# Patient Record
Sex: Male | Born: 1945
Health system: Southern US, Community
[De-identification: ages and names within clinical notes are randomized; demographics above are authoritative.]

## PROBLEM LIST (undated history)

## (undated) DIAGNOSIS — N189 Chronic kidney disease, unspecified: Secondary | ICD-10-CM

## (undated) DIAGNOSIS — M1990 Unspecified osteoarthritis, unspecified site: Secondary | ICD-10-CM

## (undated) DIAGNOSIS — H269 Unspecified cataract: Secondary | ICD-10-CM

## (undated) DIAGNOSIS — I10 Essential (primary) hypertension: Secondary | ICD-10-CM

## (undated) DIAGNOSIS — K219 Gastro-esophageal reflux disease without esophagitis: Secondary | ICD-10-CM

## (undated) DIAGNOSIS — E119 Type 2 diabetes mellitus without complications: Secondary | ICD-10-CM

## (undated) DIAGNOSIS — E785 Hyperlipidemia, unspecified: Secondary | ICD-10-CM

## (undated) DIAGNOSIS — M109 Gout, unspecified: Secondary | ICD-10-CM

## (undated) HISTORY — PX: EYE SURGERY: SHX253

## (undated) HISTORY — DX: Hyperlipidemia, unspecified: E78.5

## (undated) HISTORY — DX: Gastro-esophageal reflux disease without esophagitis: K21.9

## (undated) HISTORY — DX: Unspecified cataract: H26.9

## (undated) HISTORY — DX: Chronic kidney disease, unspecified: N18.9

## (undated) HISTORY — DX: Type 2 diabetes mellitus without complications: E11.9

## (undated) HISTORY — DX: Gout, unspecified: M10.9

## (undated) HISTORY — PX: CHOLECYSTECTOMY: SHX55

## (undated) HISTORY — DX: Unspecified osteoarthritis, unspecified site: M19.90

## (undated) HISTORY — DX: Essential (primary) hypertension: I10

---

## 2021-03-13 LAB — COLOGUARD: Cologuard: NEGATIVE

## 2021-06-20 ENCOUNTER — Encounter: Payer: Self-pay | Admitting: Sports Medicine

## 2021-06-20 ENCOUNTER — Other Ambulatory Visit: Payer: Self-pay

## 2021-06-20 ENCOUNTER — Ambulatory Visit (INDEPENDENT_AMBULATORY_CARE_PROVIDER_SITE_OTHER): Payer: Medicare HMO | Admitting: Sports Medicine

## 2021-06-20 DIAGNOSIS — E6609 Other obesity due to excess calories: Secondary | ICD-10-CM

## 2021-06-20 DIAGNOSIS — E782 Mixed hyperlipidemia: Secondary | ICD-10-CM | POA: Insufficient documentation

## 2021-06-20 DIAGNOSIS — K219 Gastro-esophageal reflux disease without esophagitis: Secondary | ICD-10-CM | POA: Diagnosis not present

## 2021-06-20 DIAGNOSIS — Z0000 Encounter for general adult medical examination without abnormal findings: Secondary | ICD-10-CM | POA: Diagnosis not present

## 2021-06-20 DIAGNOSIS — I10 Essential (primary) hypertension: Secondary | ICD-10-CM | POA: Diagnosis not present

## 2021-06-20 DIAGNOSIS — E1122 Type 2 diabetes mellitus with diabetic chronic kidney disease: Secondary | ICD-10-CM

## 2021-06-20 DIAGNOSIS — M10041 Idiopathic gout, right hand: Secondary | ICD-10-CM

## 2021-06-20 DIAGNOSIS — N182 Chronic kidney disease, stage 2 (mild): Secondary | ICD-10-CM | POA: Diagnosis not present

## 2021-06-20 DIAGNOSIS — E669 Obesity, unspecified: Secondary | ICD-10-CM | POA: Insufficient documentation

## 2021-06-20 DIAGNOSIS — M109 Gout, unspecified: Secondary | ICD-10-CM | POA: Insufficient documentation

## 2021-06-20 NOTE — Assessment & Plan Note (Signed)
Checking labs including liver function, ferritin levels, hepatic ultrasound. I would like him to establish with hematology/oncology here in [place] as he was getting serial phlebotomies in [place].

## 2021-06-20 NOTE — Assessment & Plan Note (Signed)
Controlled, no changes. 

## 2021-06-20 NOTE — Assessment & Plan Note (Signed)
Diabetes mellitus type 2, tells me his last A1c was upper sixes. Rechecking labs, foot exam today, referral for diabetic eye exam. We will work on aggressive weight loss as well.

## 2021-06-20 NOTE — Assessment & Plan Note (Signed)
Currently having a flare and currently on prednisone, we will recheck his uric acid levels and adjust allopurinol for a target uric acid less than 5. I did advise him for future flares we will do intra-articular injections rather than systemic prednisone.

## 2021-06-20 NOTE — Assessment & Plan Note (Signed)
Stable and well controlled. 

## 2021-06-20 NOTE — Progress Notes (Signed)
    Procedures performed today:    None.  Independent interpretation of notes and tests performed by another provider:   None.  Brief History, Exam, Impression, and Recommendations:    Obesity Thomas Leonard has diabetes, hypertension, hyperlipidemia, stage II CKD, gout, he would certainly benefit from aggressive weight loss, I would like the [facility] to weigh in.  Annual physical exam Pleasant 75 year old male, recently moved from [place]. Up-to-date on screening measures.  Benign essential hypertension Controlled, no changes.  GERD (gastroesophageal reflux disease) Stable and controlled, no changes.  Gout Currently having a flare and currently on prednisone, we will recheck his uric acid levels and adjust allopurinol for a target uric acid less than 5. I did advise him for future flares we will do intra-articular injections rather than systemic prednisone.  Hemochromatosis Checking labs including liver function, ferritin levels, hepatic ultrasound. I would like him to establish with hematology/oncology here in [place] as he was getting serial phlebotomies in [place].  Mixed hyperlipidemia Stable and well-controlled.  Type 2 diabetes mellitus with diabetic chronic kidney disease (HCC) Diabetes mellitus type 2, tells me his last A1c was upper sixes. Rechecking labs, foot exam today, referral for diabetic eye exam. We will work on aggressive weight loss as well.    ___________________________________________ Thomas Leonard. Benjamin Stain, M.D., ABFM., CAQSM. Primary Care and Sports Medicine [facility]  Adjunct Instructor of Family Medicine  [employer]

## 2021-06-20 NOTE — Assessment & Plan Note (Addendum)
Pleasant 75 year old male, recently moved from [place]. Up-to-date on screening measures.

## 2021-06-20 NOTE — Assessment & Plan Note (Signed)
Stable and controlled, no changes 

## 2021-06-20 NOTE — Assessment & Plan Note (Signed)
Thomas Leonard has diabetes, hypertension, hyperlipidemia, stage II CKD, gout, he would certainly benefit from aggressive weight loss, I would like the healthy weight and wellness center to weigh in.

## 2021-06-24 ENCOUNTER — Telehealth: Payer: Self-pay | Admitting: *Deleted

## 2021-06-24 NOTE — Telephone Encounter (Signed)
Per referral Dr. Thekkekandam - called and gave upcoming appointments - confirmed - mailed welcome packet with calendar 

## 2021-06-28 ENCOUNTER — Other Ambulatory Visit: Payer: Medicare HMO

## 2021-07-02 ENCOUNTER — Ambulatory Visit: Payer: Medicare HMO | Admitting: Hematology & Oncology

## 2021-07-02 ENCOUNTER — Other Ambulatory Visit: Payer: Medicare HMO

## 2021-07-10 DIAGNOSIS — M10041 Idiopathic gout, right hand: Secondary | ICD-10-CM | POA: Diagnosis not present

## 2021-07-10 DIAGNOSIS — E1122 Type 2 diabetes mellitus with diabetic chronic kidney disease: Secondary | ICD-10-CM | POA: Diagnosis not present

## 2021-07-10 DIAGNOSIS — N182 Chronic kidney disease, stage 2 (mild): Secondary | ICD-10-CM | POA: Diagnosis not present

## 2021-07-11 LAB — COMPREHENSIVE METABOLIC PANEL
AG Ratio: 1.3 (calc) (ref 1.0–2.5)
ALT: 26 U/L (ref 9–46)
AST: 17 U/L (ref 10–35)
Albumin: 3.9 g/dL (ref 3.6–5.1)
Alkaline phosphatase (APISO): 56 U/L (ref 35–144)
BUN/Creatinine Ratio: 28 (calc) — ABNORMAL HIGH (ref 6–22)
BUN: 36 mg/dL — ABNORMAL HIGH (ref 7–25)
CO2: 26 mmol/L (ref 20–32)
Calcium: 9.3 mg/dL (ref 8.6–10.3)
Chloride: 103 mmol/L (ref 98–110)
Creat: 1.29 mg/dL — ABNORMAL HIGH (ref 0.70–1.28)
Globulin: 2.9 g/dL (calc) (ref 1.9–3.7)
Glucose, Bld: 170 mg/dL — ABNORMAL HIGH (ref 65–99)
Potassium: 4.2 mmol/L (ref 3.5–5.3)
Sodium: 140 mmol/L (ref 135–146)
Total Bilirubin: 0.8 mg/dL (ref 0.2–1.2)
Total Protein: 6.8 g/dL (ref 6.1–8.1)

## 2021-07-11 LAB — TSH: TSH: 4.17 mIU/L (ref 0.40–4.50)

## 2021-07-11 LAB — URIC ACID: Uric Acid, Serum: 5.3 mg/dL (ref 4.0–8.0)

## 2021-07-11 LAB — LIPID PANEL
Cholesterol: 125 mg/dL (ref <200)
HDL: 31 mg/dL — ABNORMAL LOW (ref >=40)
LDL Cholesterol (Calc): 70 mg/dL (calc)
Non-HDL Cholesterol (Calc): 94 mg/dL (calc) (ref <130)
Total CHOL/HDL Ratio: 4 (calc) (ref <5.0)
Triglycerides: 166 mg/dL — ABNORMAL HIGH (ref <150)

## 2021-07-11 LAB — HEMOGLOBIN A1C
Hgb A1c MFr Bld: 7.3 % of total Hgb — ABNORMAL HIGH (ref <5.7)
Mean Plasma Glucose: 163 mg/dL
eAG (mmol/L): 9 mmol/L

## 2021-07-11 LAB — CBC
HCT: 39.4 % (ref 38.5–50.0)
Hemoglobin: 13.1 g/dL — ABNORMAL LOW (ref 13.2–17.1)
MCH: 32.6 pg (ref 27.0–33.0)
MCHC: 33.2 g/dL (ref 32.0–36.0)
MCV: 98 fL (ref 80.0–100.0)
MPV: 11.3 fL (ref 7.5–12.5)
Platelets: 173 10*3/uL (ref 140–400)
RBC: 4.02 10*6/uL — ABNORMAL LOW (ref 4.20–5.80)
RDW: 13.1 % (ref 11.0–15.0)
WBC: 7.7 10*3/uL (ref 3.8–10.8)

## 2021-07-11 LAB — FERRITIN: Ferritin: 367 ng/mL (ref 24–380)

## 2021-07-17 DIAGNOSIS — H524 Presbyopia: Secondary | ICD-10-CM | POA: Diagnosis not present

## 2021-07-17 DIAGNOSIS — E119 Type 2 diabetes mellitus without complications: Secondary | ICD-10-CM | POA: Diagnosis not present

## 2021-07-23 DIAGNOSIS — H2512 Age-related nuclear cataract, left eye: Secondary | ICD-10-CM | POA: Diagnosis not present

## 2021-07-23 DIAGNOSIS — H2513 Age-related nuclear cataract, bilateral: Secondary | ICD-10-CM | POA: Diagnosis not present

## 2021-07-23 DIAGNOSIS — H04123 Dry eye syndrome of bilateral lacrimal glands: Secondary | ICD-10-CM | POA: Diagnosis not present

## 2021-07-24 ENCOUNTER — Other Ambulatory Visit: Payer: Self-pay

## 2021-07-24 ENCOUNTER — Ambulatory Visit (INDEPENDENT_AMBULATORY_CARE_PROVIDER_SITE_OTHER): Payer: Medicare HMO

## 2021-07-24 DIAGNOSIS — Z9049 Acquired absence of other specified parts of digestive tract: Secondary | ICD-10-CM | POA: Diagnosis not present

## 2021-07-24 DIAGNOSIS — N281 Cyst of kidney, acquired: Secondary | ICD-10-CM | POA: Diagnosis not present

## 2021-07-24 DIAGNOSIS — K760 Fatty (change of) liver, not elsewhere classified: Secondary | ICD-10-CM | POA: Diagnosis not present

## 2021-08-01 ENCOUNTER — Inpatient Hospital Stay (HOSPITAL_BASED_OUTPATIENT_CLINIC_OR_DEPARTMENT_OTHER): Payer: Medicare HMO | Admitting: Hematology & Oncology

## 2021-08-01 ENCOUNTER — Encounter: Payer: Self-pay | Admitting: Hematology & Oncology

## 2021-08-01 ENCOUNTER — Inpatient Hospital Stay: Payer: Medicare HMO | Attending: Hematology & Oncology

## 2021-08-01 ENCOUNTER — Other Ambulatory Visit: Payer: Self-pay

## 2021-08-01 DIAGNOSIS — E1122 Type 2 diabetes mellitus with diabetic chronic kidney disease: Secondary | ICD-10-CM | POA: Diagnosis not present

## 2021-08-01 DIAGNOSIS — I10 Essential (primary) hypertension: Secondary | ICD-10-CM | POA: Diagnosis not present

## 2021-08-01 DIAGNOSIS — Z87891 Personal history of nicotine dependence: Secondary | ICD-10-CM | POA: Diagnosis not present

## 2021-08-01 LAB — CMP (CANCER CENTER ONLY)
ALT: 50 U/L — ABNORMAL HIGH (ref 0–44)
AST: 34 U/L (ref 15–41)
Albumin: 4.2 g/dL (ref 3.5–5.0)
Alkaline Phosphatase: 62 U/L (ref 38–126)
Anion gap: 8 (ref 5–15)
BUN: 31 mg/dL — ABNORMAL HIGH (ref 8–23)
CO2: 26 mmol/L (ref 22–32)
Calcium: 9.5 mg/dL (ref 8.9–10.3)
Chloride: 103 mmol/L (ref 98–111)
Creatinine: 1.16 mg/dL (ref 0.61–1.24)
GFR, Estimated: >60 mL/min (ref >60)
Glucose, Bld: 222 mg/dL — ABNORMAL HIGH (ref 70–99)
Potassium: 4.1 mmol/L (ref 3.5–5.1)
Sodium: 137 mmol/L (ref 135–145)
Total Bilirubin: 0.6 mg/dL (ref 0.3–1.2)
Total Protein: 7.6 g/dL (ref 6.5–8.1)

## 2021-08-01 LAB — CBC WITH DIFFERENTIAL (CANCER CENTER ONLY)
Abs Immature Granulocytes: 0.03 10*3/uL (ref 0.00–0.07)
Basophils Absolute: 0 10*3/uL (ref 0.0–0.1)
Basophils Relative: 0 %
Eosinophils Absolute: 0.2 10*3/uL (ref 0.0–0.5)
Eosinophils Relative: 2 %
HCT: 39.7 % (ref 39.0–52.0)
Hemoglobin: 13.4 g/dL (ref 13.0–17.0)
Immature Granulocytes: 0 %
Lymphocytes Relative: 22 %
Lymphs Abs: 2 10*3/uL (ref 0.7–4.0)
MCH: 32.9 pg (ref 26.0–34.0)
MCHC: 33.8 g/dL (ref 30.0–36.0)
MCV: 97.5 fL (ref 80.0–100.0)
Monocytes Absolute: 0.9 10*3/uL (ref 0.1–1.0)
Monocytes Relative: 10 %
Neutro Abs: 5.9 10*3/uL (ref 1.7–7.7)
Neutrophils Relative %: 66 %
Platelet Count: 183 10*3/uL (ref 150–400)
RBC: 4.07 MIL/uL — ABNORMAL LOW (ref 4.22–5.81)
RDW: 13.7 % (ref 11.5–15.5)
WBC Count: 8.9 10*3/uL (ref 4.0–10.5)
nRBC: 0 % (ref 0.0–0.2)

## 2021-08-02 ENCOUNTER — Telehealth: Payer: Self-pay | Admitting: Hematology & Oncology

## 2021-08-02 LAB — IRON AND TIBC
Iron: 112 ug/dL (ref 45–182)
Saturation Ratios: 29 % (ref 17.9–39.5)
TIBC: 391 ug/dL (ref 250–450)
UIBC: 279 ug/dL

## 2021-08-02 LAB — FERRITIN: Ferritin: 192 ng/mL (ref 24–336)

## 2021-08-02 NOTE — Progress Notes (Signed)
Referral MD  Reason for Referral: Hereditary hemochromatosis  Chief Complaint  Patient presents with   New Patient (Initial Visit)  : I have hemochromatosis.  HPI: Mr. Lacko is a really nice 75 year old white male.  He  definitely looks a lot younger.  Here is originally from [place], [place].  He was in the [employer].  He was in the [employer].  After the [employer], he got into [employer].  He subsequently moved down to [place] about 4 months ago.  He was diagnosed with hemochromatosis up in [place].  He was followed by a very Pension scheme manager.  It sounds like he is a carrier.  He says he gets phlebotomies maybe once or twice a year.  He has had no problems with abdominal pain.  He has had no issues with phlebotomies when he has them.  He has not been able to give his blood to the [employer].  He does have diabetes.  He does have hypertension.  He has had gallbladder surgery in the past.  He thinks his mom had hemochromatosis.  We will have to check his DNA analysis to see exactly what genetic mutation he has.  He has had no problems with bowels or bladder.  He did have a colonoscopy 5 years ago and has some polyps.  He was told that he did not need any other colonoscopies after this because of his age.  I really disagree with this.  He is healthy and in good shape so I do think that it would be wise to get a colonoscopy on him just to make sure that no additional polyps,.  He has had no fever.  He did have COVID I think a couple years ago.  He does not smoke.  He does not drink.  Currently, I would say his performance status is probably ECOG 0.    Past Medical History:  Diagnosis Date   Chronic kidney disease    Diabetes mellitus without complication (HCC)    Gout    Hyperlipidemia    Hypertension   :  History reviewed. No pertinent surgical history.:   Current Outpatient Medications:    allopurinol (ZYLOPRIM) 100 MG tablet, Take 400 mg  by mouth daily., Disp: , Rfl:    amLODipine (NORVASC) 5 MG tablet, Take 5 mg by mouth daily., Disp: , Rfl:    aspirin 81 MG chewable tablet, Chew 81 mg by mouth daily., Disp: , Rfl:    atorvastatin (LIPITOR) 10 MG tablet, Take 10 mg by mouth daily., Disp: , Rfl:    benazepril (LOTENSIN) 40 MG tablet, Take 40 mg by mouth daily., Disp: , Rfl:    carvedilol (COREG) 25 MG tablet, Take 25 mg by mouth 2 (two) times daily with a meal., Disp: , Rfl:    Cholecalciferol (VITAMIN D3) 25 MCG (1000 UT) CAPS, Take 2 capsules by mouth daily., Disp: , Rfl:    Colchicine 0.6 MG CAPS, Take 1 capsule by mouth daily., Disp: , Rfl:    esomeprazole (NEXIUM) 20 MG capsule, Take 20 mg by mouth daily at 12 noon., Disp: , Rfl:    glipiZIDE (GLUCOTROL) 5 MG tablet, Take 10 mg by mouth daily., Disp: , Rfl:    hydrochlorothiazide (MICROZIDE) 12.5 MG capsule, Take 12.5 mg by mouth daily., Disp: , Rfl:    metFORMIN (GLUCOPHAGE) 500 MG tablet, Take 500 mg by mouth 2 (two) times daily with a meal., Disp: , Rfl:    Omega-3 Fatty Acids (  FISH OIL) 1000 MG CAPS, Take 2 capsules by mouth daily., Disp: , Rfl: :  :  Not on File:   Family History  Problem Relation Age of Onset   Liver cancer Other    High blood pressure Other   :   Social History   Socioeconomic History   Marital status: Married    Spouse name: Not on file   Number of children: Not on file   Years of education: Not on file   Highest education level: Not on file  Occupational History   Occupation: Retired  Tobacco Use   Smoking status: Former    Types: Cigarettes   Smokeless tobacco: Never  Substance and Sexual Activity   Alcohol use: Yes    Comment: 1/month   Drug use: Never   Sexual activity: Yes  Other Topics Concern   Not on file  Social History Narrative   Not on file   Social Determinants of Health   Financial Resource Strain: Not on file  Food Insecurity: Not on file  Transportation Needs: Not on file  Physical Activity: Not on  file  Stress: Not on file  Social Connections: Not on file  Intimate Partner Violence: Not on file  :  Review of Systems  Constitutional: Negative.   HENT: Negative.    Eyes: Negative.   Respiratory: Negative.    Cardiovascular: Negative.   Gastrointestinal: Negative.   Genitourinary: Negative.   Musculoskeletal: Negative.   Skin: Negative.   Neurological: Negative.   Endo/Heme/Allergies: Negative.   Psychiatric/Behavioral: Negative.      Exam: @IPVITALS @ This is a well-developed well-nourished white male in no obvious distress.  Vital signs show temperature of 97.9.  Pulse 50.  Blood pressure 131/75.  Weight is 213 pounds.  Head and neck exam shows no ocular or oral lesions.  There are no palpable cervical or supraclavicular lymph nodes.  Lungs are clear bilaterally.  Cardiac exam regular rate and rhythm with no murmurs, rubs or bruits.  Abdomen is soft.  He has good bowel sounds.  He has well-healed laparoscopic scars from his gallbladder surgery.  There is no palpable abdominal mass.  There is no palpable liver or spleen tip.  Back exam shows no tenderness over the spine, ribs or hips.  Extremities shows no clubbing, cyanosis or edema.  Neurological exam shows no focal neurological deficits.  Skin exam shows no rashes, ecchymoses or petechia.   Recent Labs    08/01/21 1340  WBC 8.9  HGB 13.4  HCT 39.7  PLT 183    Recent Labs    08/01/21 1340  NA 137  K 4.1  CL 103  CO2 26  GLUCOSE 222*  BUN 31*  CREATININE 1.16  CALCIUM 9.5    Blood smear review: None  Pathology: None    Assessment and Plan: Mr. Knightly is a really nice 75 year old white male.  He is from [place].  He moved down to [place].  He has family down here.  He really enjoys being down here.  He has hemochromatosis.  He says he is a carrier.  We will have to see what his genetic analysis shows.  I would have to think that he may have the C282Y mutation.  We will have to see what his iron  studies show.  We may have to go by his iron saturation is whether or not he needs to be phlebotomized.  I do think that his blood would be good to go to the  [facility].  He says he only saw the doctor up in [place] once a year.  We can certainly set him up with blood work by itself every 3 or 4 months.  It sounds like this is not can be a problem for Mr. Arreguin.  He is a lot of fun to talk to.  His only problem is that he likes the [place]!!!  I would like to see him back in 6 months just so we can follow-up since he moved down here.

## 2021-08-02 NOTE — Telephone Encounter (Signed)
Scheduled appt per 10/20 los - mailed letter with appt date and time   

## 2021-08-05 ENCOUNTER — Other Ambulatory Visit: Payer: Self-pay

## 2021-08-05 ENCOUNTER — Telehealth: Payer: Self-pay

## 2021-08-05 NOTE — Telephone Encounter (Signed)
Patient informed of lab results and understands scheduling will call him to set up his phlebotomy. Patient declines any questions or concerns.

## 2021-08-05 NOTE — Telephone Encounter (Signed)
-----   Message from Josph Macho, MD sent at 08/03/2021  6:53 AM EDT ----- Please call him and tell him that the iron levels are borderline.  We probably do need to do 1 phlebotomy for him.  Please set this up.  Cindee Lame

## 2021-08-07 ENCOUNTER — Other Ambulatory Visit: Payer: Self-pay

## 2021-08-07 LAB — HEMOCHROMATOSIS DNA-PCR(C282Y,H63D)

## 2021-08-07 MED ORDER — ALLOPURINOL 100 MG PO TABS
400.0000 mg | ORAL_TABLET | Freq: Every day | ORAL | 3 refills | Status: DC
  Filled 2021-08-07 – 2021-11-25 (×4): qty 360, 90d supply, fill #0

## 2021-08-07 MED ORDER — GLIPIZIDE 5 MG PO TABS
10.0000 mg | ORAL_TABLET | Freq: Every day | ORAL | 3 refills | Status: DC
  Filled 2021-08-07: qty 180, 90d supply, fill #0
  Filled 2021-11-04: qty 180, 90d supply, fill #1
  Filled 2021-11-04: qty 180, 90d supply, fill #0
  Filled 2022-01-27: qty 180, 90d supply, fill #1
  Filled 2022-04-23: qty 180, 90d supply, fill #2

## 2021-08-07 MED ORDER — ATORVASTATIN CALCIUM 10 MG PO TABS
10.0000 mg | ORAL_TABLET | Freq: Every day | ORAL | 3 refills | Status: DC
  Filled 2021-08-07 – 2022-01-27 (×2): qty 90, 90d supply, fill #0

## 2021-08-08 ENCOUNTER — Other Ambulatory Visit: Payer: Self-pay

## 2021-08-14 DIAGNOSIS — H2512 Age-related nuclear cataract, left eye: Secondary | ICD-10-CM | POA: Diagnosis not present

## 2021-08-14 DIAGNOSIS — I10 Essential (primary) hypertension: Secondary | ICD-10-CM | POA: Diagnosis not present

## 2021-08-14 DIAGNOSIS — K219 Gastro-esophageal reflux disease without esophagitis: Secondary | ICD-10-CM | POA: Diagnosis not present

## 2021-08-14 DIAGNOSIS — K449 Diaphragmatic hernia without obstruction or gangrene: Secondary | ICD-10-CM | POA: Diagnosis not present

## 2021-08-14 DIAGNOSIS — M1990 Unspecified osteoarthritis, unspecified site: Secondary | ICD-10-CM | POA: Diagnosis not present

## 2021-08-14 DIAGNOSIS — E119 Type 2 diabetes mellitus without complications: Secondary | ICD-10-CM | POA: Diagnosis not present

## 2021-08-14 DIAGNOSIS — E7800 Pure hypercholesterolemia, unspecified: Secondary | ICD-10-CM | POA: Diagnosis not present

## 2021-08-15 ENCOUNTER — Other Ambulatory Visit: Payer: Self-pay

## 2021-08-23 ENCOUNTER — Other Ambulatory Visit: Payer: Self-pay

## 2021-08-28 DIAGNOSIS — E119 Type 2 diabetes mellitus without complications: Secondary | ICD-10-CM | POA: Diagnosis not present

## 2021-08-28 DIAGNOSIS — I10 Essential (primary) hypertension: Secondary | ICD-10-CM | POA: Diagnosis not present

## 2021-08-28 DIAGNOSIS — H2511 Age-related nuclear cataract, right eye: Secondary | ICD-10-CM | POA: Diagnosis not present

## 2021-08-28 DIAGNOSIS — N182 Chronic kidney disease, stage 2 (mild): Secondary | ICD-10-CM | POA: Diagnosis not present

## 2021-08-28 DIAGNOSIS — M1990 Unspecified osteoarthritis, unspecified site: Secondary | ICD-10-CM | POA: Diagnosis not present

## 2021-08-28 DIAGNOSIS — K219 Gastro-esophageal reflux disease without esophagitis: Secondary | ICD-10-CM | POA: Diagnosis not present

## 2021-08-28 DIAGNOSIS — K449 Diaphragmatic hernia without obstruction or gangrene: Secondary | ICD-10-CM | POA: Diagnosis not present

## 2021-08-28 DIAGNOSIS — E7800 Pure hypercholesterolemia, unspecified: Secondary | ICD-10-CM | POA: Diagnosis not present

## 2021-09-06 ENCOUNTER — Other Ambulatory Visit: Payer: Self-pay | Admitting: Sports Medicine

## 2021-09-16 ENCOUNTER — Encounter: Payer: Self-pay | Admitting: Sports Medicine

## 2021-09-16 ENCOUNTER — Other Ambulatory Visit: Payer: Self-pay

## 2021-09-16 MED ORDER — METFORMIN HCL 500 MG PO TABS: 500.0000 mg | ORAL_TABLET | Freq: Two times a day (BID) | ORAL | 3 refills | Status: DC

## 2021-09-16 NOTE — Telephone Encounter (Signed)
We have not prescribed these medications for the patient previously.  Please review and refill if appropriate.  T. Lewis Grivas, CMA  

## 2021-10-10 ENCOUNTER — Encounter: Payer: Self-pay | Admitting: Sports Medicine

## 2021-10-10 DIAGNOSIS — M10041 Idiopathic gout, right hand: Secondary | ICD-10-CM

## 2021-10-10 MED ORDER — INDOMETHACIN 50 MG PO CAPS: 50.0000 mg | ORAL_CAPSULE | Freq: Two times a day (BID) | ORAL | 0 refills | Status: DC

## 2021-10-21 ENCOUNTER — Ambulatory Visit (INDEPENDENT_AMBULATORY_CARE_PROVIDER_SITE_OTHER): Payer: Medicare HMO | Admitting: Sports Medicine

## 2021-10-21 ENCOUNTER — Ambulatory Visit (INDEPENDENT_AMBULATORY_CARE_PROVIDER_SITE_OTHER): Payer: Medicare HMO

## 2021-10-21 ENCOUNTER — Other Ambulatory Visit: Payer: Self-pay

## 2021-10-21 VITALS — BP 172/85 | HR 52 | Wt 226.0 lb

## 2021-10-21 DIAGNOSIS — N182 Chronic kidney disease, stage 2 (mild): Secondary | ICD-10-CM

## 2021-10-21 DIAGNOSIS — M10041 Idiopathic gout, right hand: Secondary | ICD-10-CM | POA: Diagnosis not present

## 2021-10-21 DIAGNOSIS — I10 Essential (primary) hypertension: Secondary | ICD-10-CM | POA: Diagnosis not present

## 2021-10-21 DIAGNOSIS — E1122 Type 2 diabetes mellitus with diabetic chronic kidney disease: Secondary | ICD-10-CM

## 2021-10-21 DIAGNOSIS — R2242 Localized swelling, mass and lump, left lower limb: Secondary | ICD-10-CM

## 2021-10-21 DIAGNOSIS — Z0000 Encounter for general adult medical examination without abnormal findings: Secondary | ICD-10-CM

## 2021-10-21 DIAGNOSIS — M7989 Other specified soft tissue disorders: Secondary | ICD-10-CM | POA: Diagnosis not present

## 2021-10-21 DIAGNOSIS — R600 Localized edema: Secondary | ICD-10-CM | POA: Diagnosis not present

## 2021-10-21 LAB — POCT GLYCOSYLATED HEMOGLOBIN (HGB A1C): Hemoglobin A1C: 7.2 % — AB (ref 4.0–5.6)

## 2021-10-21 MED ORDER — AMLODIPINE BESYLATE 10 MG PO TABS: 10.0000 mg | ORAL_TABLET | Freq: Every day | ORAL | 3 refills | Status: DC

## 2021-10-21 NOTE — Assessment & Plan Note (Signed)
Thomas Leonard does endorse isolated swelling left lower extremity, question chronic DVT. Adding ultrasound. He will also wear his compression hose as symptoms are worst at the end of the day.

## 2021-10-21 NOTE — Assessment & Plan Note (Signed)
Uric acid levels are under adequate control on current dose of allopurinol, no change in plan.

## 2021-10-21 NOTE — Progress Notes (Signed)
° ° °  Procedures performed today:    None.  Independent interpretation of notes and tests performed by another provider:   None.  Brief History, Exam, Impression, and Recommendations:    Annual physical exam Patient would like to hold off on Tdap and Shingrix for now, we will revisit this next year. Cologuard was negative in June of this year.   Benign essential hypertension Blood pressure is moderately elevated today, increasing amlodipine to 10 mg. Nurse visit blood pressure check in 2 weeks.  Gout Uric acid levels are under adequate control on current dose of allopurinol, no change in plan.  Type 2 diabetes mellitus with diabetic chronic kidney disease (HCC) A1c under adequate control, he has had his diabetic eye exam, he also had phacoemulsification and vision is fantastic now.  Localized swelling of left lower extremity Ritter does endorse isolated swelling left lower extremity, question chronic DVT. Adding ultrasound. He will also wear his compression hose as symptoms are worst at the end of the day.  Chronic process with exacerbation and pharmacologic intervention  ___________________________________________ Ihor Austin. Benjamin Stain, M.D., ABFM., CAQSM. Primary Care and Sports Medicine [place] [facility]  Adjunct Instructor of Family Medicine  [employer]

## 2021-10-21 NOTE — Assessment & Plan Note (Signed)
Patient would like to hold off on Tdap and Shingrix for now, we will revisit this next year. Cologuard was negative in June of this year.

## 2021-10-21 NOTE — Assessment & Plan Note (Signed)
Blood pressure is moderately elevated today, increasing amlodipine to 10 mg. Nurse visit blood pressure check in 2 weeks.

## 2021-10-21 NOTE — Assessment & Plan Note (Signed)
A1c under adequate control, he has had his diabetic eye exam, he also had phacoemulsification and vision is fantastic now.

## 2021-11-01 ENCOUNTER — Other Ambulatory Visit: Payer: Self-pay

## 2021-11-01 ENCOUNTER — Inpatient Hospital Stay: Payer: Medicare HMO | Attending: Hematology & Oncology

## 2021-11-01 LAB — CBC WITH DIFFERENTIAL (CANCER CENTER ONLY)
Abs Immature Granulocytes: 0.02 10*3/uL (ref 0.00–0.07)
Basophils Absolute: 0.1 10*3/uL (ref 0.0–0.1)
Basophils Relative: 1 %
Eosinophils Absolute: 0.3 10*3/uL (ref 0.0–0.5)
Eosinophils Relative: 4 %
HCT: 39.6 % (ref 39.0–52.0)
Hemoglobin: 13.4 g/dL (ref 13.0–17.0)
Immature Granulocytes: 0 %
Lymphocytes Relative: 25 %
Lymphs Abs: 1.7 10*3/uL (ref 0.7–4.0)
MCH: 32.7 pg (ref 26.0–34.0)
MCHC: 33.8 g/dL (ref 30.0–36.0)
MCV: 96.6 fL (ref 80.0–100.0)
Monocytes Absolute: 0.7 10*3/uL (ref 0.1–1.0)
Monocytes Relative: 10 %
Neutro Abs: 4.2 10*3/uL (ref 1.7–7.7)
Neutrophils Relative %: 60 %
Platelet Count: 158 10*3/uL (ref 150–400)
RBC: 4.1 MIL/uL — ABNORMAL LOW (ref 4.22–5.81)
RDW: 13.2 % (ref 11.5–15.5)
WBC Count: 6.9 10*3/uL (ref 4.0–10.5)
nRBC: 0 % (ref 0.0–0.2)

## 2021-11-01 LAB — CMP (CANCER CENTER ONLY)
ALT: 48 U/L — ABNORMAL HIGH (ref 0–44)
AST: 27 U/L (ref 15–41)
Albumin: 4.5 g/dL (ref 3.5–5.0)
Alkaline Phosphatase: 74 U/L (ref 38–126)
Anion gap: 9 (ref 5–15)
BUN: 28 mg/dL — ABNORMAL HIGH (ref 8–23)
CO2: 27 mmol/L (ref 22–32)
Calcium: 10 mg/dL (ref 8.9–10.3)
Chloride: 103 mmol/L (ref 98–111)
Creatinine: 1.24 mg/dL (ref 0.61–1.24)
GFR, Estimated: >60 mL/min (ref >60)
Glucose, Bld: 242 mg/dL — ABNORMAL HIGH (ref 70–99)
Potassium: 4.5 mmol/L (ref 3.5–5.1)
Sodium: 139 mmol/L (ref 135–145)
Total Bilirubin: 0.7 mg/dL (ref 0.3–1.2)
Total Protein: 7.2 g/dL (ref 6.5–8.1)

## 2021-11-01 LAB — IRON AND IRON BINDING CAPACITY (CC-WL,HP ONLY)
Iron: 111 ug/dL (ref 45–182)
Saturation Ratios: 32 % (ref 17.9–39.5)
TIBC: 353 ug/dL (ref 250–450)
UIBC: 242 ug/dL (ref 117–376)

## 2021-11-04 ENCOUNTER — Other Ambulatory Visit: Payer: Self-pay

## 2021-11-04 ENCOUNTER — Ambulatory Visit (INDEPENDENT_AMBULATORY_CARE_PROVIDER_SITE_OTHER): Payer: Medicare HMO | Admitting: Sports Medicine

## 2021-11-04 DIAGNOSIS — I10 Essential (primary) hypertension: Secondary | ICD-10-CM

## 2021-11-04 LAB — FERRITIN: Ferritin: 259 ng/mL (ref 24–336)

## 2021-11-04 NOTE — Progress Notes (Signed)
Patient is here for blood pressure check. Denies any chest pains, SOB, palpitations, lightheadedness, dizziness, mood, sleep or medication problems.   Patient's blood pressure was not at goal. Patient sat for 10 minutes for a recheck. Blood pressure reading was not at goal. Per provider's, patient is to continue with current regimen and return in 2 weeks for a blood pressure check. Patient advised to schedule a NV in 2 wks.

## 2021-11-04 NOTE — Assessment & Plan Note (Signed)
Blood pressure improved to 0000000 over [id number] systolic. He has only been taking his 10 mg amlodipine for a week now, he will continue for another 2 weeks and then we can recheck his blood pressure in a nurse visit, he will also continue to cut back on sodium. Goal is less than 140/90.

## 2021-11-06 ENCOUNTER — Other Ambulatory Visit: Payer: Self-pay | Admitting: Sports Medicine

## 2021-11-06 DIAGNOSIS — M10041 Idiopathic gout, right hand: Secondary | ICD-10-CM

## 2021-11-11 ENCOUNTER — Ambulatory Visit (INDEPENDENT_AMBULATORY_CARE_PROVIDER_SITE_OTHER): Payer: Medicare HMO | Admitting: Sports Medicine

## 2021-11-11 DIAGNOSIS — Z0000 Encounter for general adult medical examination without abnormal findings: Secondary | ICD-10-CM

## 2021-11-11 NOTE — Patient Instructions (Addendum)
[place] Maintenance Summary and Written Plan of Care  [name] ,  Thank you for allowing me to perform your Medicare Annual Wellness Visit and for your ongoing commitment to your health.   Health Maintenance & Immunization History Health Maintenance  Topic Date Due   COVID-19 Vaccine (4 - Booster for Pfizer series) 11/27/2021 (Originally 09/27/2020)   Zoster Vaccines- Shingrix (1 of 2) 02/09/2022 (Originally 05/09/1996)   Pneumonia Vaccine 10+ Years old (1 - PCV) 11/11/2022 (Originally 05/09/1952)   Hepatitis C Screening  11/11/2022 (Originally 05/09/1964)   HEMOGLOBIN A1C  04/20/2022   FOOT EXAM  06/20/2022   OPHTHALMOLOGY EXAM  08/21/2022   COLONOSCOPY (Pts 45-62yrs Insurance coverage will need to be confirmed)  06/13/2025   TETANUS/TDAP  03/27/2030   INFLUENZA VACCINE  Completed   HPV VACCINES  Aged Out   Immunization History  Administered Date(s) Administered   Influenza, High Dose Seasonal PF 07/27/2021   PFIZER(Purple Top)SARS-COV-2 Vaccination 11/05/2019, 11/23/2019, 08/02/2020   Tdap 03/27/2020    These are the patient goals that we discussed:  Goals Addressed              This Visit's Progress     Patient Stated (pt-stated)        Would like to loose 25 lbs.        This is a list of Health Maintenance Items that are overdue or due now: Pneumococcal vaccine  Shingrix   Patient stated that he had the Pneumonia vaccine when he was 76 years old.   Orders/Referrals Placed Today: No orders of the defined types were placed in this encounter.  (Contact our referral department at [phone] if you have not spoken with someone about your referral appointment within the next 5 days)    Follow-up Plan Follow-up with Thomas Decamp, MD as planned Schedule your Shingrix vaccine at your pharmacy. Please talk with Dr. Darene Leonard regarding the Pneumonia vaccine. If you are able to get the records from your previous  doctor, that will be great. It will help in determining if you need any more Pneumonia vaccines. Medicare wellness visit in one year Patient will access on my chart.      Health Maintenance, Male Adopting a healthy lifestyle and getting preventive care are important in promoting health and wellness. Ask your health care provider about: The right schedule for you to have regular tests and exams. Things you can do on your own to prevent diseases and keep yourself healthy. What should I know about diet, weight, and exercise? Eat a healthy diet  Eat a diet that includes plenty of vegetables, fruits, low-fat dairy products, and lean protein. Do not eat a lot of foods that are high in solid fats, added sugars, or sodium. Maintain a healthy weight Body mass index (BMI) is a measurement that can be used to identify possible weight problems. It estimates body fat based on height and weight. Your health care provider can help determine your BMI and help you achieve or maintain a healthy weight. Get regular exercise Get regular exercise. This is one of the most important things you can do for your health. Most adults should: Exercise for at least 150 minutes each week. The exercise should increase your heart rate and make you sweat (moderate-intensity exercise). Do strengthening exercises at least twice a week. This is in addition to the moderate-intensity exercise. Spend less time sitting. Even light physical activity can be beneficial. Watch cholesterol and blood lipids Have your blood  tested for lipids and cholesterol at 76 years of age, then have this test every 5 years. You may need to have your cholesterol levels checked more often if: Your lipid or cholesterol levels are high. You are older than 76 years of age. You are at high risk for heart disease. What should I know about cancer screening? Many types of cancers can be detected early and may often be prevented. Depending on your health  history and family history, you may need to have cancer screening at various ages. This may include screening for: Colorectal cancer. Prostate cancer. Skin cancer. Lung cancer. What should I know about heart disease, diabetes, and high blood pressure? Blood pressure and heart disease High blood pressure causes heart disease and increases the risk of stroke. This is more likely to develop in people who have high blood pressure readings or are overweight. Talk with your health care provider about your target blood pressure readings. Have your blood pressure checked: Every 3-5 years if you are 5-47 years of age. Every year if you are 79 years old or older. If you are between the ages of 25 and 46 and are a current or former smoker, ask your health care provider if you should have a one-time screening for abdominal aortic aneurysm (AAA). Diabetes Have regular diabetes screenings. This checks your fasting blood sugar level. Have the screening done: Once every three years after age 59 if you are at a normal weight and have a low risk for diabetes. More often and at a younger age if you are overweight or have a high risk for diabetes. What should I know about preventing infection? Hepatitis B If you have a higher risk for hepatitis B, you should be screened for this virus. Talk with your health care provider to find out if you are at risk for hepatitis B infection. Hepatitis C Blood testing is recommended for: Everyone born from [birth year] through 1965. Anyone with known risk factors for hepatitis C. Sexually transmitted infections (STIs) You should be screened each year for STIs, including gonorrhea and chlamydia, if: You are sexually active and are younger than 76 years of age. You are older than 76 years of age and your health care provider tells you that you are at risk for this type of infection. Your sexual activity has changed since you were last screened, and you are at increased risk for  chlamydia or gonorrhea. Ask your health care provider if you are at risk. Ask your health care provider about whether you are at high risk for HIV. Your health care provider may recommend a prescription medicine to help prevent HIV infection. If you choose to take medicine to prevent HIV, you should first get tested for HIV. You should then be tested every 3 months for as long as you are taking the medicine. Follow these instructions at home: Alcohol use Do not drink alcohol if your health care provider tells you not to drink. If you drink alcohol: Limit how much you have to 0-2 drinks a day. Know how much alcohol is in your drink. In the [place] one drink equals one 12 oz bottle of beer (355 mL), one 5 oz glass of wine (148 mL), or one 1 oz glass of hard liquor (44 mL). Lifestyle Do not use any products that contain nicotine or tobacco. These products include cigarettes, chewing tobacco, and vaping devices, such as e-cigarettes. If you need help quitting, ask your health care provider. Do not use street drugs. Do  not share needles. Ask your health care provider for help if you need support or information about quitting drugs. General instructions Schedule regular health, dental, and eye exams. Stay current with your vaccines. Tell your health care provider if: You often feel depressed. You have ever been abused or do not feel safe at home. Summary Adopting a healthy lifestyle and getting preventive care are important in promoting health and wellness. Follow your health care provider's instructions about healthy diet, exercising, and getting tested or screened for diseases. Follow your health care provider's instructions on monitoring your cholesterol and blood pressure. This information is not intended to replace advice given to you by your health care provider. Make sure you discuss any questions you have with your health care provider. Document Revised: 02/18/2021 Document Reviewed:  02/18/2021 Elsevier Patient Education  [employer].

## 2021-11-11 NOTE — [facility] Notes (Signed)
MEDICARE ANNUAL WELLNESS VISIT  11/11/2021  Telephone Visit Disclaimer This Medicare AWV was conducted by telephone due to national recommendations for restrictions regarding the COVID-19 Pandemic (e.g. social distancing).  I verified, using two identifiers, that I am speaking with Thomas Leonard or their authorized healthcare agent. I discussed the limitations, risks, security, and privacy concerns of performing an evaluation and management service by telephone and the potential availability of an in-person appointment in the future. The patient expressed understanding and agreed to proceed.  Location of Patient: Home Location of Provider (nurse):  In the office.  Subjective:    Thomas Leonard is a 76 y.o. male patient of Thekkekandam, Ihor Austin, MD who had a Medicare Annual Wellness Visit today via telephone. Thomas Leonard is Retired and lives with their spouse. he has 2 children. he reports that he is socially active and does interact with friends/family regularly. he is moderately physically active and enjoys playing golf and reading.  Patient Care Team: Monica Becton, MD as PCP - General (Family Medicine)  Advanced Directives 11/11/2021 08/01/2021 06/20/2021  Does Patient Have a Medical Advance Directive? Yes Yes No  Type of Advance Directive Living will;Healthcare Power of Attorney Living will -  Does patient want to make changes to medical advance directive? No - Patient declined No - Patient declined -  Copy of Healthcare Power of Attorney in Chart? No - copy requested - -  Would patient like information on creating a medical advance directive? - - No - Patient declined    Hospital Utilization Over the Past 12 Months: # of hospitalizations or ER visits: 0 # of surgeries: 0  Review of Systems    Patient reports that his overall health is unchanged compared to last year.  History obtained from chart review and the patient  Patient Reported Readings (BP, Pulse, CBG,  Weight, etc) none  Pain Assessment Pain : No/denies pain     Current Medications & Allergies (verified) Allergies as of 11/11/2021   No Known Allergies      Medication List        Accurate as of November 11, 2021  3:22 PM. If you have any questions, ask your nurse or doctor.          Accu-Chek Aviva Plus test strip Generic drug: glucose blood 1 each daily.   Accu-Chek Softclix Lancets lancets   allopurinol 100 MG tablet Commonly known as: ZYLOPRIM Take 4 tablets (400 mg total) by mouth daily.   amLODipine 10 MG tablet Commonly known as: NORVASC Take 1 tablet (10 mg total) by mouth daily.   aspirin 81 MG chewable tablet Chew 81 mg by mouth daily.   atorvastatin 10 MG tablet Commonly known as: LIPITOR Take 1 tablet (10 mg total) by mouth daily.   benazepril 40 MG tablet Commonly known as: LOTENSIN Take 40 mg by mouth daily.   carvedilol 25 MG tablet Commonly known as: COREG Take 25 mg by mouth 2 (two) times daily with a meal.   Colchicine 0.6 MG Caps Take 1 capsule by mouth daily.   esomeprazole 20 MG capsule Commonly known as: NEXIUM Take 20 mg by mouth daily at 12 noon.   Fish Oil 1000 MG Caps Take 2 capsules by mouth daily.   glipiZIDE 5 MG tablet Commonly known as: GLUCOTROL Take 2 tablets (10 mg total) by mouth daily.   hydrochlorothiazide 12.5 MG capsule Commonly known as: MICROZIDE Take 12.5 mg by mouth daily.   indomethacin 50 MG capsule Commonly known  as: INDOCIN TAKE 1 CAPSULE TWICE A DAY WITH FOOD   metFORMIN 500 MG tablet Commonly known as: GLUCOPHAGE Take 1 tablet (500 mg total) by mouth 2 (two) times daily with a meal.   Vitamin D3 25 MCG (1000 UT) Caps Take 2 capsules by mouth daily.        History (reviewed): Past Medical History:  Diagnosis Date   Arthritis    10 yrs ago   Cataract    4 yrs ago having surgery on noth eyes on November 2 snd 16th   Chronic kidney disease    Diabetes mellitus without complication  ([place])    GERD (gastroesophageal reflux disease) 8 yrs ago   Gout    Hyperlipidemia    Hypertension    Past Surgical History:  Procedure Laterality Date   CHOLECYSTECTOMY  February 2021   EYE SURGERY  08/21/2021   Family History  Problem Relation Age of Onset   Liver cancer Other    High blood pressure Other    Arthritis Father    Diabetes Father    Kidney disease Father    Social History   Socioeconomic History   Marital status: Married    Spouse name: Vinnie Level   Number of children: 2   Years of education: 14   Highest education level: Associate degree: academic program  Occupational History   Occupation: Retired  Tobacco Use   Smoking status: Former    Packs/day: 0.00    Years: 20.00    Pack years: 0.00    Types: Cigarettes    Quit date: 11/17/2002    Years since quitting: 18.9   Smokeless tobacco: Never   Tobacco comments:    Quite over 20 yrs ago  Substance and Sexual Activity   Alcohol use: Not Currently    Comment: Occasionally   Drug use: Never   Sexual activity: Yes  Other Topics Concern   Not on file  Social History Narrative   Lives with his wife. They have two children. They are in [place] and [place]. He enjoys playing golf and reading.   Social Determinants of Health   Financial Resource Strain: Low Risk    Difficulty of Paying Living Expenses: Not hard at all  Food Insecurity: No Food Insecurity   Worried About Charity fundraiser in the Last Year: Never true   [place] in the Last Year: Never true  Transportation Needs: No Transportation Needs   Lack of Transportation (Medical): No   Lack of Transportation (Non-Medical): No  Physical Activity: Insufficiently Active   Days of Exercise per Week: 3 days   Minutes of Exercise per Session: 30 min  Stress: No Stress Concern Present   Feeling of Stress : Not at all  Social Connections: Socially Integrated   Frequency of Communication with Friends and Family: More than three times a week    Frequency of Social Gatherings with Friends and Family: Three times a week   Attends Religious Services: More than 4 times per year   Active Member of Clubs or Organizations: Yes   Attends Archivist Meetings: More than 4 times per year   Marital Status: Married    Activities of Daily Living In your present state of health, do you have any difficulty performing the following activities: 11/11/2021 11/07/2021  Hearing? N N  Vision? N N  Difficulty concentrating or making decisions? N N  Walking or climbing stairs? N N  Dressing or bathing? N N  Doing  errands, shopping? N N  Preparing Food and eating ? N N  Using the Toilet? N N  In the past six months, have you accidently leaked urine? N N  Do you have problems with loss of bowel control? N N  Managing your Medications? N N  Managing your Finances? N N  Housekeeping or managing your Housekeeping? N N    Patient Education/ Literacy How often do you need to have someone help you when you read instructions, pamphlets, or other written materials from your doctor or pharmacy?: 1 - Never What is the last grade level you completed in school?: Associates degree  Exercise Current Exercise Habits: Home exercise routine, Type of exercise: walking, Time (Minutes): 25, Frequency (Times/Week): 3, Weekly Exercise (Minutes/Week): 75, Intensity: Moderate, Exercise limited by: None identified  Diet Patient reports consuming 3 meals a day and 0 snack(s) a day Patient reports that his primary diet is: Regular Patient reports that she does have regular access to food.   Depression Screen PHQ 2/9 Scores 11/11/2021 10/21/2021 10/21/2021 06/20/2021  PHQ - 2 Score 0 0 0 0  PHQ- 9 Score - - - 0     Fall Risk Fall Risk  11/11/2021 11/07/2021 10/21/2021 06/20/2021  Falls in the past year? 0 0 0 0  Number falls in past yr: 0 - 0 0  Injury with Fall? 0 0 0 0  Risk for fall due to : No Fall Risks - - -  Follow up Falls evaluation completed;Education  provided - - -     Objective:  Thomas Leonard seemed alert and oriented and he participated appropriately during our telephone visit.  Blood Pressure Weight BMI  BP Readings from Last 3 Encounters:  11/04/21 (!) 147/77  10/21/21 (!) 172/85  08/01/21 131/75   Wt Readings from Last 3 Encounters:  11/04/21 224 lb 0.6 oz (101.6 kg)  10/21/21 226 lb (102.5 kg)  08/01/21 213 lb (96.6 kg)   BMI Readings from Last 1 Encounters:  11/04/21 32.15 kg/m    *Unable to obtain current vital signs, weight, and BMI due to telephone visit type  Hearing/Vision  Percell Miller did not seem to have difficulty with hearing/understanding during the telephone conversation Reports that he has had a formal eye exam by an eye care professional within the past year Reports that he has not had a formal hearing evaluation within the past year *Unable to fully assess hearing and vision during telephone visit type  Cognitive Function: 6CIT Screen 11/11/2021  What Year? 0 points  What month? 0 points  What time? 0 points  Count back from 20 0 points  Months in reverse 0 points  Repeat phrase 0 points  Total Score 0   (Normal:0-7, Significant for Dysfunction: >8)  Normal Cognitive Function Screening: Yes   Immunization & Health Maintenance Record Immunization History  Administered Date(s) Administered   Influenza, High Dose Seasonal PF 07/27/2021   PFIZER(Purple Top)SARS-COV-2 Vaccination 11/05/2019, 11/23/2019, 08/02/2020   Tdap 03/27/2020    Health Maintenance  Topic Date Due   COVID-19 Vaccine (4 - Booster for Pfizer series) 11/27/2021 (Originally 09/27/2020)   Zoster Vaccines- Shingrix (1 of 2) 02/09/2022 (Originally 05/09/1996)   Pneumonia Vaccine 60+ Years old (1 - PCV) 11/11/2022 (Originally 05/09/1952)   Hepatitis C Screening  11/11/2022 (Originally 05/09/1964)   HEMOGLOBIN A1C  04/20/2022   FOOT EXAM  06/20/2022   OPHTHALMOLOGY EXAM  08/21/2022   COLONOSCOPY (Pts 45-47yrs Insurance coverage  will need to be confirmed)  06/13/2025  TETANUS/TDAP  03/27/2030   INFLUENZA VACCINE  Completed   HPV VACCINES  Aged Out       Assessment  This is a routine wellness examination for [employer].  Health Maintenance: Due or Overdue There are no preventive care reminders to display for this patient.   Thomas Leonard does not need a referral for [employer] Assistance: Care Management:   no Social Work:    no Prescription Assistance:  no Nutrition/Diabetes Education:  no   Plan:  Personalized Goals  Goals Addressed               This Visit's Progress     Patient Stated (pt-stated)        Would like to loose 25 lbs.       Personalized Health Maintenance & Screening Recommendations  Pneumococcal vaccine  Shingrix   Patient stated that he had the Pneumonia vaccine when he was 76 year old.   Lung Cancer Screening Recommended: no (Low Dose CT Chest recommended if Age 39-80 years, 30 pack-year currently smoking OR have quit w/in past 15 years) Hepatitis C Screening recommended: yes HIV Screening recommended: no  Advanced Directives: Written information was not prepared per patient's request.  Referrals & Orders No orders of the defined types were placed in this encounter.   Follow-up Plan Follow-up with Silverio Decamp, MD as planned Schedule your Shingrix vaccine at your pharmacy. Please talk with Dr. Darene Lamer regarding the Pneumonia vaccine. If you are able to get the records from your previous doctor, that will be great. It will help in determining if you need any more Pneumonia vaccines. Medicare wellness visit in one year Patient will access on my chart.   I have personally reviewed and noted the following in the patients chart:   Medical and social history Use of alcohol, tobacco or illicit drugs  Current medications and supplements Functional ability and status Nutritional status Physical activity Advanced directives List of other  physicians Hospitalizations, surgeries, and ER visits in previous 12 months Vitals Screenings to include cognitive, depression, and falls Referrals and appointments  In addition, I have reviewed and discussed with Thomas Leonard certain preventive protocols, quality metrics, and best practice recommendations. A written personalized care plan for preventive services as well as general preventive health recommendations is available and can be mailed to the patient at his request.      Tinnie Gens, RN  11/11/2021

## 2021-11-18 ENCOUNTER — Ambulatory Visit: Payer: Medicare HMO

## 2021-11-20 ENCOUNTER — Ambulatory Visit (INDEPENDENT_AMBULATORY_CARE_PROVIDER_SITE_OTHER): Payer: Medicare HMO | Admitting: Family Medicine

## 2021-11-20 ENCOUNTER — Other Ambulatory Visit: Payer: Self-pay

## 2021-11-20 VITALS — BP 112/64 | HR 62 | Resp 20

## 2021-11-20 DIAGNOSIS — I10 Essential (primary) hypertension: Secondary | ICD-10-CM | POA: Diagnosis not present

## 2021-11-20 NOTE — [facility] Notes (Signed)
Pt taking medication as prescribed without problems. First BP is 134/75, second BP after sitting for 10 minutes is 112/64.

## 2021-11-20 NOTE — [facility] Notes (Signed)
Established Patient Office Visit  Subjective:  Patient ID: Thomas Leonard, male    DOB: [birth year]  Age: 76 y.o. MRN: [id number]  CC:  Chief Complaint  Patient presents with   Hypertension    HPI Thomas Leonard presents for a BP check. Pt denies chest pain, SOB, dizziness, or heart palpitations. Taking meds as directed w/o problems. Denies medication side effects.  Past Medical History:  Diagnosis Date   Arthritis    10 yrs ago   Cataract    4 yrs ago having surgery on noth eyes on November 2 snd 16th   Chronic kidney disease    Diabetes mellitus without complication ([facility])    GERD (gastroesophageal reflux disease) 8 yrs ago   Gout    Hyperlipidemia    Hypertension     Past Surgical History:  Procedure Laterality Date   CHOLECYSTECTOMY  February 2021   EYE SURGERY  08/21/2021    Family History  Problem Relation Age of Onset   Liver cancer Other    High blood pressure Other    Arthritis Father    Diabetes Father    Kidney disease Father     Social History   Socioeconomic History   Marital status: Married    Spouse name: Thomas Leonard   Number of children: 2   Years of education: 14   Highest education level: Associate degree: academic program  Occupational History   Occupation: Retired  Tobacco Use   Smoking status: Former    Packs/day: 0.00    Years: 20.00    Pack years: 0.00    Types: Cigarettes    Quit date: 11/17/2002    Years since quitting: 19.0   Smokeless tobacco: Never   Tobacco comments:    Quite over 20 yrs ago  Substance and Sexual Activity   Alcohol use: Not Currently    Comment: Occasionally   Drug use: Never   Sexual activity: Yes  Other Topics Concern   Not on file  Social History Narrative   Lives with his wife. They have two children. They are in [place] and [place]. He enjoys playing golf and reading.   Social Determinants of Health   Financial Resource Strain: Low Risk    Difficulty of Paying Living Expenses: Not hard at  all  Food Insecurity: No Food Insecurity   Worried About Programme researcher, broadcasting/film/video in the Last Year: Never true   Ran Out of Food in the Last Year: Never true  Transportation Needs: No Transportation Needs   Lack of Transportation (Medical): No   Lack of Transportation (Non-Medical): No  Physical Activity: Insufficiently Active   Days of Exercise per Week: 3 days   Minutes of Exercise per Session: 30 min  Stress: No Stress Concern Present   Feeling of Stress : Not at all  Social Connections: Socially Integrated   Frequency of Communication with Friends and Family: More than three times a week   Frequency of Social Gatherings with Friends and Family: Three times a week   Attends Religious Services: More than 4 times per year   Active Member of Clubs or Organizations: Yes   Attends Engineer, structural: More than 4 times per year   Marital Status: Married  Catering manager Violence: Not At Risk   Fear of Current or Ex-Partner: No   Emotionally Abused: No   Physically Abused: No   Sexually Abused: No    Outpatient Medications Prior to Visit  Medication Sig Dispense Refill  ACCU-CHEK AVIVA PLUS test strip 1 each daily.     Accu-Chek Softclix Lancets lancets      allopurinol (ZYLOPRIM) 100 MG tablet Take 4 tablets (400 mg total) by mouth daily. 360 tablet 3   amLODipine (NORVASC) 10 MG tablet Take 1 tablet (10 mg total) by mouth daily. 90 tablet 3   aspirin 81 MG chewable tablet Chew 81 mg by mouth daily.     atorvastatin (LIPITOR) 10 MG tablet Take 1 tablet (10 mg total) by mouth daily. 90 tablet 3   benazepril (LOTENSIN) 40 MG tablet Take 40 mg by mouth daily.     carvedilol (COREG) 25 MG tablet Take 25 mg by mouth 2 (two) times daily with a meal.     Cholecalciferol (VITAMIN D3) 25 MCG (1000 UT) CAPS Take 2 capsules by mouth daily.     esomeprazole (NEXIUM) 20 MG capsule Take 20 mg by mouth daily at 12 noon.     glipiZIDE (GLUCOTROL) 5 MG tablet Take 2 tablets (10 mg total) by  mouth daily. 180 tablet 3   hydrochlorothiazide (MICROZIDE) 12.5 MG capsule Take 12.5 mg by mouth daily.     indomethacin (INDOCIN) 50 MG capsule TAKE 1 CAPSULE TWICE A DAY WITH FOOD 60 capsule 0   metFORMIN (GLUCOPHAGE) 500 MG tablet Take 1 tablet (500 mg total) by mouth 2 (two) times daily with a meal. 180 tablet 3   Omega-3 Fatty Acids (FISH OIL) 1000 MG CAPS Take 2 capsules by mouth daily.     Colchicine 0.6 MG CAPS Take 1 capsule by mouth daily. (Patient not taking: Reported on 11/04/2021)     No facility-administered medications prior to visit.    No Known Allergies  ROS Review of Systems    Objective:    Physical Exam  BP 134/75 (BP Location: Right Arm, Patient Position: Sitting, Cuff Size: Large)    Pulse 60    Resp 20    SpO2 99%  Wt Readings from Last 3 Encounters:  11/04/21 224 lb 0.6 oz (101.6 kg)  10/21/21 226 lb (102.5 kg)  08/01/21 213 lb (96.6 kg)     There are no preventive care reminders to display for this patient.  There are no preventive care reminders to display for this patient.  Lab Results  Component Value Date   TSH 4.17 07/10/2021   Lab Results  Component Value Date   WBC 6.9 11/01/2021   HGB 13.4 11/01/2021   HCT 39.6 11/01/2021   MCV 96.6 11/01/2021   PLT 158 11/01/2021   Lab Results  Component Value Date   NA 139 11/01/2021   K 4.5 11/01/2021   CO2 27 11/01/2021   GLUCOSE 242 (H) 11/01/2021   BUN 28 (H) 11/01/2021   CREATININE 1.24 11/01/2021   BILITOT 0.7 11/01/2021   ALKPHOS 74 11/01/2021   AST 27 11/01/2021   ALT 48 (H) 11/01/2021   PROT 7.2 11/01/2021   ALBUMIN 4.5 11/01/2021   CALCIUM 10.0 11/01/2021   ANIONGAP 9 11/01/2021   Lab Results  Component Value Date   CHOL 125 07/10/2021   Lab Results  Component Value Date   HDL 31 (L) 07/10/2021   Lab Results  Component Value Date   LDLCALC 70 07/10/2021   Lab Results  Component Value Date   TRIG 166 (H) 07/10/2021   Lab Results  Component Value Date   CHOLHDL  4.0 07/10/2021   Lab Results  Component Value Date   HGBA1C 7.2 (A) 10/21/2021  Assessment & Plan:  First BP is 134/75, second BP after sitting for 10 minutes is 112/64. Problem List Items Addressed This Visit       Cardiovascular and Mediastinum   Benign essential hypertension - Primary    No orders of the defined types were placed in this encounter.   Follow-up: No follow-ups on file.    Kathrynn Speed, CMA

## 2021-11-24 NOTE — [facility] Notes (Signed)
Agree with documentation as above.   Kyrillos Adams, MD  

## 2021-11-25 ENCOUNTER — Other Ambulatory Visit: Payer: Self-pay

## 2021-11-26 ENCOUNTER — Other Ambulatory Visit: Payer: Self-pay

## 2021-12-16 ENCOUNTER — Encounter: Payer: Self-pay | Admitting: Sports Medicine

## 2021-12-16 ENCOUNTER — Other Ambulatory Visit: Payer: Self-pay | Admitting: Sports Medicine

## 2021-12-16 DIAGNOSIS — M10041 Idiopathic gout, right hand: Secondary | ICD-10-CM

## 2021-12-16 MED ORDER — INDOMETHACIN 50 MG PO CAPS: ORAL_CAPSULE | ORAL | 3 refills | Status: DC

## 2021-12-16 MED ORDER — INDOMETHACIN 50 MG PO CAPS: ORAL_CAPSULE | ORAL | 0 refills | Status: DC

## 2021-12-16 NOTE — Telephone Encounter (Signed)
Wife called and stated that the refill went to the mail order pharmacy and patient need some today. 30 day supply sent to [facility] [address]  ?

## 2021-12-19 ENCOUNTER — Other Ambulatory Visit: Payer: Self-pay

## 2022-01-23 ENCOUNTER — Encounter: Payer: Self-pay | Admitting: Sports Medicine

## 2022-01-27 ENCOUNTER — Other Ambulatory Visit: Payer: Self-pay

## 2022-01-30 ENCOUNTER — Inpatient Hospital Stay: Payer: Medicare HMO

## 2022-01-30 ENCOUNTER — Inpatient Hospital Stay: Payer: Medicare HMO | Attending: Hematology & Oncology | Admitting: Hematology & Oncology

## 2022-01-30 ENCOUNTER — Encounter: Payer: Self-pay | Admitting: Hematology & Oncology

## 2022-01-30 ENCOUNTER — Other Ambulatory Visit: Payer: Self-pay

## 2022-01-30 LAB — CMP (CANCER CENTER ONLY)
ALT: 45 U/L — ABNORMAL HIGH (ref 0–44)
AST: 27 U/L (ref 15–41)
Albumin: 4.6 g/dL (ref 3.5–5.0)
Alkaline Phosphatase: 64 U/L (ref 38–126)
Anion gap: 7 (ref 5–15)
BUN: 32 mg/dL — ABNORMAL HIGH (ref 8–23)
CO2: 28 mmol/L (ref 22–32)
Calcium: 10.1 mg/dL (ref 8.9–10.3)
Chloride: 104 mmol/L (ref 98–111)
Creatinine: 1.18 mg/dL (ref 0.61–1.24)
GFR, Estimated: >60 mL/min (ref >60)
Glucose, Bld: 213 mg/dL — ABNORMAL HIGH (ref 70–99)
Potassium: 4.2 mmol/L (ref 3.5–5.1)
Sodium: 139 mmol/L (ref 135–145)
Total Bilirubin: 0.6 mg/dL (ref 0.3–1.2)
Total Protein: 7.7 g/dL (ref 6.5–8.1)

## 2022-01-30 LAB — CBC WITH DIFFERENTIAL (CANCER CENTER ONLY)
Abs Immature Granulocytes: 0.03 10*3/uL (ref 0.00–0.07)
Basophils Absolute: 0.1 10*3/uL (ref 0.0–0.1)
Basophils Relative: 1 %
Eosinophils Absolute: 0.4 10*3/uL (ref 0.0–0.5)
Eosinophils Relative: 4 %
HCT: 39.4 % (ref 39.0–52.0)
Hemoglobin: 13.3 g/dL (ref 13.0–17.0)
Immature Granulocytes: 0 %
Lymphocytes Relative: 24 %
Lymphs Abs: 1.9 10*3/uL (ref 0.7–4.0)
MCH: 32.6 pg (ref 26.0–34.0)
MCHC: 33.8 g/dL (ref 30.0–36.0)
MCV: 96.6 fL (ref 80.0–100.0)
Monocytes Absolute: 0.7 10*3/uL (ref 0.1–1.0)
Monocytes Relative: 9 %
Neutro Abs: 4.9 10*3/uL (ref 1.7–7.7)
Neutrophils Relative %: 62 %
Platelet Count: 178 10*3/uL (ref 150–400)
RBC: 4.08 MIL/uL — ABNORMAL LOW (ref 4.22–5.81)
RDW: 13.9 % (ref 11.5–15.5)
WBC Count: 8 10*3/uL (ref 4.0–10.5)
nRBC: 0 % (ref 0.0–0.2)

## 2022-01-30 LAB — FERRITIN: Ferritin: 148 ng/mL (ref 24–336)

## 2022-01-30 NOTE — Progress Notes (Signed)
?Hematology and Oncology Follow Up Visit ? ?Thomas Leonard ?[id number] ?1946/05/24 76 y.o. ?01/30/2022 ? ? ?Principle Diagnosis:  ?Hereditary hemochromatosis- C282Y -heterozygous ? ?Current Therapy:   ?Phlebotomy to maintain ferritin less than 100 and iron saturation less than 30% ?    ?Interim History:  Mr. Considine is back for follow-up.  This is his second office visit.  We found that he is heterozygous for one of the major mutations- C282Y. ? ?When we saw him, his ferritin was 259 with iron saturation of 32%. ? ?He has not been phlebotomized yet.  I told him that he could always go to the [facility] and do a blood donation.  His blood is safe.  There is absolutely no contraindication to donating blood. ? ?His blood sugars are quite high.  I am not sure how these are being addressed.  I am sure his family doctor is dealing with this. ? ?He has had no problems with fever.  There is been no issues with COVID.  He had no problems over Winter.. ? ?There is been no change in bowel or bladder habits. ? ?He has had no bleeding.  There are no rashes.  He has no tingling in the hands or feet. ? ?Overall, his performance status is ECOG 0. ? ?Medications:  ?Current Outpatient Medications:  ?  ACCU-CHEK AVIVA PLUS test strip, 1 each daily., Disp: , Rfl:  ?  Accu-Chek Softclix Lancets lancets, , Disp: , Rfl:  ?  allopurinol (ZYLOPRIM) 100 MG tablet, Take 4 tablets (400 mg total) by mouth daily., Disp: 360 tablet, Rfl: 3 ?  amLODipine (NORVASC) 10 MG tablet, Take 1 tablet (10 mg total) by mouth daily., Disp: 90 tablet, Rfl: 3 ?  aspirin 81 MG chewable tablet, Chew 81 mg by mouth daily., Disp: , Rfl:  ?  atorvastatin (LIPITOR) 10 MG tablet, Take 1 tablet (10 mg total) by mouth daily., Disp: 90 tablet, Rfl: 3 ?  benazepril (LOTENSIN) 40 MG tablet, Take 40 mg by mouth daily., Disp: , Rfl:  ?  carvedilol (COREG) 25 MG tablet, Take 25 mg by mouth 2 (two) times daily with a meal., Disp: , Rfl:  ?  Cholecalciferol (VITAMIN D3) 25 MCG  (1000 UT) CAPS, Take 2 capsules by mouth daily., Disp: , Rfl:  ?  esomeprazole (NEXIUM) 20 MG capsule, Take 20 mg by mouth daily at 12 noon., Disp: , Rfl:  ?  glipiZIDE (GLUCOTROL) 5 MG tablet, Take 2 tablets (10 mg total) by mouth daily., Disp: 180 tablet, Rfl: 3 ?  hydrochlorothiazide (MICROZIDE) 12.5 MG capsule, Take 12.5 mg by mouth daily., Disp: , Rfl:  ?  metFORMIN (GLUCOPHAGE) 500 MG tablet, Take 1 tablet (500 mg total) by mouth 2 (two) times daily with a meal., Disp: 180 tablet, Rfl: 3 ?  Omega-3 Fatty Acids (FISH OIL) 1000 MG CAPS, Take 2 capsules by mouth daily., Disp: , Rfl:  ? ?Allergies: No Known Allergies ? ?Past Medical History, Surgical history, Social history, and Family History were reviewed and updated. ? ?Review of Systems: ?Review of Systems  ?Constitutional: Negative.   ?HENT:  Negative.    ?Eyes: Negative.   ?Respiratory: Negative.    ?Cardiovascular: Negative.   ?Gastrointestinal: Negative.   ?Endocrine: Negative.   ?Genitourinary: Negative.    ?Musculoskeletal: Negative.   ?Skin: Negative.   ?Neurological: Negative.   ?Hematological: Negative.   ?Psychiatric/Behavioral: Negative.    ? ?Physical Exam: ? height is 5\' 10"  (1.778 m) and weight is 219 lb (99.3 kg). His  oral temperature is 98.4 ?F (36.9 ?C). His blood pressure is 136/69 and his pulse is 55 (abnormal). His respiration is 19 and oxygen saturation is 98%.  ? ?Wt Readings from Last 3 Encounters:  ?01/30/22 219 lb (99.3 kg)  ?11/04/21 224 lb 0.6 oz (101.6 kg)  ?10/21/21 226 lb (102.5 kg)  ? ? ?Physical Exam ?Vitals reviewed.  ?HENT:  ?   Head: Normocephalic and atraumatic.  ?Eyes:  ?   Pupils: Pupils are equal, round, and reactive to light.  ?Cardiovascular:  ?   Rate and Rhythm: Normal rate and regular rhythm.  ?   Heart sounds: Normal heart sounds.  ?Pulmonary:  ?   Effort: Pulmonary effort is normal.  ?   Breath sounds: Normal breath sounds.  ?Abdominal:  ?   General: Bowel sounds are normal.  ?   Palpations: Abdomen is soft.   ?Musculoskeletal:     ?   General: No tenderness or deformity. Normal range of motion.  ?   Cervical back: Normal range of motion.  ?Lymphadenopathy:  ?   Cervical: No cervical adenopathy.  ?Skin: ?   General: Skin is warm and dry.  ?   Findings: No erythema or rash.  ?Neurological:  ?   Mental Status: He is alert and oriented to person, place, and time.  ?Psychiatric:     ?   Behavior: Behavior normal.     ?   Thought Content: Thought content normal.     ?   Judgment: Judgment normal.  ? ? ? ?Lab Results  ?Component Value Date  ? WBC 8.0 01/30/2022  ? HGB 13.3 01/30/2022  ? HCT 39.4 01/30/2022  ? MCV 96.6 01/30/2022  ? PLT 178 01/30/2022  ? ?  Chemistry   ?   ?Component Value Date/Time  ? NA 139 01/30/2022 1129  ? K 4.2 01/30/2022 1129  ? CL 104 01/30/2022 1129  ? CO2 28 01/30/2022 1129  ? BUN 32 (H) 01/30/2022 1129  ? CREATININE 1.18 01/30/2022 1129  ? CREATININE 1.29 (H) 07/10/2021 0805  ?    ?Component Value Date/Time  ? CALCIUM 10.1 01/30/2022 1129  ? ALKPHOS 64 01/30/2022 1129  ? AST 27 01/30/2022 1129  ? ALT 45 (H) 01/30/2022 1129  ? BILITOT 0.6 01/30/2022 1129  ?  ? ? ? ?Impression and Plan: ?Mr. Elbaz is a very nice 76 year old white male.  He is originally from [place].  He served in [employer]. ? ?He has hemochromatosis.  We will see what his iron studies show.  Again, if he can donate blood, this would certainly help him out.  His blood counts are fine so he would have no problems donating. ? ?From my point of view, his biggest problem is his diabetes.  This will certainly be more of a issue clinically down the road. ? ?We will continue to monitor his iron levels.  If had to phlebotomize him here we certainly can. ? ?We will see him back in 6 months. ? ? ?Volanda Napoleon, MD ?4/20/20231:05 PM  ?

## 2022-01-31 LAB — IRON AND IRON BINDING CAPACITY (CC-WL,HP ONLY)
Iron: 86 ug/dL (ref 45–182)
Saturation Ratios: 26 % (ref 17.9–39.5)
TIBC: 336 ug/dL (ref 250–450)
UIBC: 250 ug/dL (ref 117–376)

## 2022-02-06 ENCOUNTER — Other Ambulatory Visit: Payer: Self-pay

## 2022-02-06 MED ORDER — HYDROCHLOROTHIAZIDE 12.5 MG PO CAPS: 12.5000 mg | ORAL_CAPSULE | Freq: Every day | ORAL | 1 refills | Status: DC

## 2022-02-06 MED ORDER — BENAZEPRIL HCL 40 MG PO TABS: 40.0000 mg | ORAL_TABLET | Freq: Every day | ORAL | 1 refills | Status: DC

## 2022-02-06 MED ORDER — ATORVASTATIN CALCIUM 10 MG PO TABS: 10.0000 mg | ORAL_TABLET | Freq: Every day | ORAL | 1 refills | Status: DC

## 2022-02-06 MED ORDER — CARVEDILOL 25 MG PO TABS: 25.0000 mg | ORAL_TABLET | Freq: Two times a day (BID) | ORAL | 1 refills | Status: DC

## 2022-02-12 ENCOUNTER — Other Ambulatory Visit: Payer: Self-pay

## 2022-02-19 ENCOUNTER — Other Ambulatory Visit: Payer: Self-pay

## 2022-02-19 MED ORDER — BD SWAB SINGLE USE REGULAR PADS: 1.0000 | MEDICATED_PAD | Freq: Every day | 1 refills | Status: DC

## 2022-02-19 MED ORDER — ACCU-CHEK SOFTCLIX LANCETS MISC: 1.0000 | Freq: Every day | 1 refills | Status: DC

## 2022-02-19 MED ORDER — ALLOPURINOL 100 MG PO TABS
400.0000 mg | ORAL_TABLET | Freq: Every day | ORAL | 1 refills | Status: DC
Start: 2022-02-19 — End: 2022-08-06

## 2022-02-19 MED ORDER — ACCU-CHEK AVIVA PLUS VI STRP: 1.0000 | ORAL_STRIP | Freq: Every day | 1 refills | Status: DC

## 2022-02-24 ENCOUNTER — Other Ambulatory Visit: Payer: Self-pay

## 2022-04-23 ENCOUNTER — Other Ambulatory Visit: Payer: Self-pay

## 2022-05-06 ENCOUNTER — Ambulatory Visit (INDEPENDENT_AMBULATORY_CARE_PROVIDER_SITE_OTHER): Payer: Medicare HMO | Admitting: Sports Medicine

## 2022-05-06 ENCOUNTER — Encounter: Payer: Self-pay | Admitting: Sports Medicine

## 2022-05-06 VITALS — BP 132/71 | HR 55 | Ht 70.0 in | Wt 217.0 lb

## 2022-05-06 DIAGNOSIS — R2242 Localized swelling, mass and lump, left lower limb: Secondary | ICD-10-CM

## 2022-05-06 DIAGNOSIS — Z0000 Encounter for general adult medical examination without abnormal findings: Secondary | ICD-10-CM

## 2022-05-06 DIAGNOSIS — E1122 Type 2 diabetes mellitus with diabetic chronic kidney disease: Secondary | ICD-10-CM | POA: Diagnosis not present

## 2022-05-06 DIAGNOSIS — N182 Chronic kidney disease, stage 2 (mild): Secondary | ICD-10-CM | POA: Diagnosis not present

## 2022-05-06 LAB — POCT GLYCOSYLATED HEMOGLOBIN (HGB A1C): HbA1c, POC (controlled diabetic range): 7 % (ref 0.0–7.0)

## 2022-05-06 NOTE — Assessment & Plan Note (Addendum)
Hesitant on Shingrix and pneumococcal vaccination. I will print him out some information, I will not push him to get the vaccine if he does not want to though, and he does understand that we will decrease his risk of morbidity and mortality from pneumococcal pneumonia as well as decrease his chance of getting shingles.

## 2022-05-06 NOTE — Assessment & Plan Note (Signed)
Bilateral lower extremity edema, improved with cutting out alcohol, he will wear compression hose, elevate his feet, return to see me as needed for this. I also informed him that some of the swelling was normal with high-dose amlodipine use.

## 2022-05-06 NOTE — Progress Notes (Signed)
    Procedures performed today:    None.  Independent interpretation of notes and tests performed by another provider:   None.  Brief History, Exam, Impression, and Recommendations:    Type 2 diabetes mellitus with diabetic chronic kidney disease (HCC) Pleasant 76 year old male, currently on metformin, glipizide. He has cut back on drinking, he has lost some weight and his A1c has improved to 7%. He is due for diabetic eye exam so I will go ahead and order this. He is up-to-date on all of his other diabetic screenings. Today he said that he does not think metformin is doing anything for him and he would like to stop it and do a over-the-counter supplement. Explained to him the limitations in data with over-the-counter supplements, but we will do a test, I will discontinue metformin and he can take a supplement for 3 months and we can bring him back and see what his A1c has done. I did explain to him that A1c would likely worsen and his weight would likely increase off of metformin. If his numbers worsen we will restart metformin but use the extended release formulation.  Localized swelling of left lower extremity Bilateral lower extremity edema, improved with cutting out alcohol, he will wear compression hose, elevate his feet, return to see me as needed for this. I also informed him that some of the swelling was normal with high-dose amlodipine use.  Annual physical exam Hesitant on Shingrix and pneumococcal vaccination. I will print him out some information, I will not push him to get the vaccine if he does not want to though, and he does understand that we will decrease his risk of morbidity and mortality from pneumococcal pneumonia as well as decrease his chance of getting shingles.    ____________________________________________ Ihor Austin. Benjamin Stain, M.D., ABFM., CAQSM., AME. Primary Care and Sports Medicine [facility]  Adjunct Professor of Family  Medicine  [place] [employer]  Restaurant manager, fast food

## 2022-05-06 NOTE — Assessment & Plan Note (Signed)
Pleasant 76 year old male, currently on metformin, glipizide. He has cut back on drinking, he has lost some weight and his A1c has improved to 7%. He is due for diabetic eye exam so I will go ahead and order this. He is up-to-date on all of his other diabetic screenings. Today he said that he does not think metformin is doing anything for him and he would like to stop it and do a over-the-counter supplement. Explained to him the limitations in data with over-the-counter supplements, but we will do a test, I will discontinue metformin and he can take a supplement for 3 months and we can bring him back and see what his A1c has done. I did explain to him that A1c would likely worsen and his weight would likely increase off of metformin. If his numbers worsen we will restart metformin but use the extended release formulation.

## 2022-06-20 ENCOUNTER — Other Ambulatory Visit: Payer: Self-pay | Admitting: Sports Medicine

## 2022-07-28 ENCOUNTER — Encounter: Payer: Self-pay | Admitting: Sports Medicine

## 2022-07-28 MED ORDER — GLIPIZIDE 5 MG PO TABS: 10.0000 mg | ORAL_TABLET | Freq: Every day | ORAL | 3 refills | Status: DC

## 2022-07-31 ENCOUNTER — Inpatient Hospital Stay: Payer: Medicare HMO | Admitting: Medical Oncology

## 2022-07-31 ENCOUNTER — Inpatient Hospital Stay: Payer: Medicare HMO | Attending: Hematology & Oncology

## 2022-07-31 ENCOUNTER — Encounter: Payer: Self-pay | Admitting: Medical Oncology

## 2022-07-31 DIAGNOSIS — N182 Chronic kidney disease, stage 2 (mild): Secondary | ICD-10-CM

## 2022-07-31 DIAGNOSIS — E1122 Type 2 diabetes mellitus with diabetic chronic kidney disease: Secondary | ICD-10-CM | POA: Diagnosis not present

## 2022-07-31 LAB — CBC WITH DIFFERENTIAL (CANCER CENTER ONLY)
Abs Immature Granulocytes: 0.03 10*3/uL (ref 0.00–0.07)
Basophils Absolute: 0.1 10*3/uL (ref 0.0–0.1)
Basophils Relative: 1 %
Eosinophils Absolute: 0.3 10*3/uL (ref 0.0–0.5)
Eosinophils Relative: 3 %
HCT: 42.2 % (ref 39.0–52.0)
Hemoglobin: 14.1 g/dL (ref 13.0–17.0)
Immature Granulocytes: 0 %
Lymphocytes Relative: 26 %
Lymphs Abs: 1.9 10*3/uL (ref 0.7–4.0)
MCH: 32 pg (ref 26.0–34.0)
MCHC: 33.4 g/dL (ref 30.0–36.0)
MCV: 95.9 fL (ref 80.0–100.0)
Monocytes Absolute: 0.6 10*3/uL (ref 0.1–1.0)
Monocytes Relative: 8 %
Neutro Abs: 4.5 10*3/uL (ref 1.7–7.7)
Neutrophils Relative %: 62 %
Platelet Count: 163 10*3/uL (ref 150–400)
RBC: 4.4 MIL/uL (ref 4.22–5.81)
RDW: 13.4 % (ref 11.5–15.5)
WBC Count: 7.3 10*3/uL (ref 4.0–10.5)
nRBC: 0 % (ref 0.0–0.2)

## 2022-07-31 LAB — CMP (CANCER CENTER ONLY)
ALT: 49 U/L — ABNORMAL HIGH (ref 0–44)
AST: 26 U/L (ref 15–41)
Albumin: 4.7 g/dL (ref 3.5–5.0)
Alkaline Phosphatase: 69 U/L (ref 38–126)
Anion gap: 9 (ref 5–15)
BUN: 31 mg/dL — ABNORMAL HIGH (ref 8–23)
CO2: 27 mmol/L (ref 22–32)
Calcium: 10 mg/dL (ref 8.9–10.3)
Chloride: 105 mmol/L (ref 98–111)
Creatinine: 1.23 mg/dL (ref 0.61–1.24)
GFR, Estimated: >60 mL/min (ref >60)
Glucose, Bld: 159 mg/dL — ABNORMAL HIGH (ref 70–99)
Potassium: 3.9 mmol/L (ref 3.5–5.1)
Sodium: 141 mmol/L (ref 135–145)
Total Bilirubin: 0.6 mg/dL (ref 0.3–1.2)
Total Protein: 7.4 g/dL (ref 6.5–8.1)

## 2022-07-31 LAB — FERRITIN: Ferritin: 139 ng/mL (ref 24–336)

## 2022-07-31 NOTE — Progress Notes (Addendum)
Hematology and Oncology Follow Up Visit  Thomas Leonard [id number] 08/03/1946 76 y.o. 07/31/2022   Principle Diagnosis:  Hereditary hemochromatosis- C282Y -heterozygous  Current Therapy:   Phlebotomy to maintain ferritin less than 100 and iron saturation less than 30%     Interim History:  Thomas Leonard is back for follow-up.  This is his third office visit.  We found that he is heterozygous for one of the major mutations- C282Y.  At his last visit his ferritin was 148 with iron saturation of 26%.  In the past he elects to donate his blood when his counts are elevated.   Blood sugars have been trending down to a healthy range with assistance of PCP.   No headaches, fatigue, bowel changes, skin problems.   Overall, his performance status is ECOG 0.  Medications:  Current Outpatient Medications:    ACCU-CHEK AVIVA PLUS test strip, 1 each by Other route daily., Disp: 300 each, Rfl: 1   Accu-Chek Softclix Lancets lancets, 1 each by Other route daily., Disp: 300 each, Rfl: 1   Alcohol Swabs (B-D SINGLE USE SWABS REGULAR) PADS, 1 each by Does not apply route daily., Disp: 300 each, Rfl: 1   allopurinol (ZYLOPRIM) 100 MG tablet, Take 4 tablets (400 mg total) by mouth daily., Disp: 360 tablet, Rfl: 1   amLODipine (NORVASC) 10 MG tablet, Take 1 tablet (10 mg total) by mouth daily., Disp: 90 tablet, Rfl: 3   aspirin 81 MG chewable tablet, Chew 81 mg by mouth daily., Disp: , Rfl:    atorvastatin (LIPITOR) 10 MG tablet, TAKE 1 TABLET EVERY DAY, Disp: 90 tablet, Rfl: 1   benazepril (LOTENSIN) 40 MG tablet, Take 1 tablet (40 mg total) by mouth daily., Disp: 90 tablet, Rfl: 1   carvedilol (COREG) 25 MG tablet, Take 1 tablet (25 mg total) by mouth 2 (two) times daily with a meal., Disp: 90 tablet, Rfl: 1   Cholecalciferol (VITAMIN D3) 25 MCG (1000 UT) CAPS, Take 2 capsules by mouth daily., Disp: , Rfl:    esomeprazole (NEXIUM) 20 MG capsule, Take 20 mg by mouth daily at 12 noon., Disp: , Rfl:     glipiZIDE (GLUCOTROL) 5 MG tablet, Take 2 tablets (10 mg total) by mouth daily., Disp: 180 tablet, Rfl: 3   hydrochlorothiazide (MICROZIDE) 12.5 MG capsule, Take 1 capsule (12.5 mg total) by mouth daily., Disp: 90 capsule, Rfl: 1   Omega-3 Fatty Acids (FISH OIL) 1000 MG CAPS, Take 2 capsules by mouth daily., Disp: , Rfl:   Allergies: No Known Allergies  Past Medical History, Surgical history, Social history, and Family History were reviewed and updated.  Review of Systems: Review of Systems  Constitutional: Negative.   HENT:  Negative.    Eyes: Negative.   Respiratory: Negative.    Cardiovascular: Negative.   Gastrointestinal: Negative.   Endocrine: Negative.   Genitourinary: Negative.    Musculoskeletal: Negative.   Skin: Negative.   Neurological: Negative.   Hematological: Negative.   Psychiatric/Behavioral: Negative.      Physical Exam:  weight is 215 lb (97.5 kg). His oral temperature is 97.8 F (36.6 C). His blood pressure is 137/84 and his pulse is 51 (abnormal). His respiration is 17 and oxygen saturation is 96%.   Wt Readings from Last 3 Encounters:  07/31/22 215 lb (97.5 kg)  05/06/22 217 lb (98.4 kg)  01/30/22 219 lb (99.3 kg)    Physical Exam Vitals reviewed.  HENT:     Head: Normocephalic and atraumatic.  Eyes:  Pupils: Pupils are equal, round, and reactive to light.  Cardiovascular:     Rate and Rhythm: Normal rate and regular rhythm.     Heart sounds: Normal heart sounds.  Pulmonary:     Effort: Pulmonary effort is normal.     Breath sounds: Normal breath sounds.  Abdominal:     General: Bowel sounds are normal.     Palpations: Abdomen is soft.  Musculoskeletal:        General: No tenderness or deformity. Normal range of motion.     Cervical back: Normal range of motion.  Lymphadenopathy:     Cervical: No cervical adenopathy.  Skin:    General: Skin is warm and dry.     Findings: No erythema or rash.  Neurological:     Mental Status: He  is alert and oriented to person, place, and time.  Psychiatric:        Behavior: Behavior normal.        Thought Content: Thought content normal.        Judgment: Judgment normal.      Lab Results  Component Value Date   WBC 7.3 07/31/2022   HGB 14.1 07/31/2022   HCT 42.2 07/31/2022   MCV 95.9 07/31/2022   PLT 163 07/31/2022     Chemistry      Component Value Date/Time   NA 141 07/31/2022 1014   K 3.9 07/31/2022 1014   CL 105 07/31/2022 1014   CO2 27 07/31/2022 1014   BUN 31 (H) 07/31/2022 1014   CREATININE 1.23 07/31/2022 1014   CREATININE 1.29 (H) 07/10/2021 0805      Component Value Date/Time   CALCIUM 10.0 07/31/2022 1014   ALKPHOS 69 07/31/2022 1014   AST 26 07/31/2022 1014   ALT 49 (H) 07/31/2022 1014   BILITOT 0.6 07/31/2022 1014       Impression and Plan: Thomas Leonard is a very nice 76 year old white male.  He is originally from [place].  He served in [employer].  He has hemochromatosis which is chronic for him.  Iron studies pending at time of visit. If above target range his Hemoglobin would support a donation of blood.   RTC 6 months for follow up or sooner as needed. Continue PCP follow up. Also discussed that should he noticed any fatigue, dizziness he may want to consider seeing a cardiologist for his bradycardia which is chronic and unchanged. He reports that he is asymptomatic at this time.    Hughie Closs, PA-C 10/19/202311:18 AM

## 2022-07-31 NOTE — Addendum Note (Signed)
Addended by: Nelwyn Salisbury on: 07/31/2022 11:19 AM   Modules accepted: Orders

## 2022-08-01 LAB — IRON AND IRON BINDING CAPACITY (CC-WL,HP ONLY)
Iron: 84 ug/dL (ref 45–182)
Saturation Ratios: 24 % (ref 17.9–39.5)
TIBC: 351 ug/dL (ref 250–450)
UIBC: 267 ug/dL (ref 117–376)

## 2022-08-06 ENCOUNTER — Ambulatory Visit (INDEPENDENT_AMBULATORY_CARE_PROVIDER_SITE_OTHER): Payer: Medicare HMO | Admitting: Sports Medicine

## 2022-08-06 ENCOUNTER — Encounter: Payer: Self-pay | Admitting: Sports Medicine

## 2022-08-06 VITALS — BP 121/76 | HR 58 | Wt 218.0 lb

## 2022-08-06 DIAGNOSIS — N182 Chronic kidney disease, stage 2 (mild): Secondary | ICD-10-CM

## 2022-08-06 DIAGNOSIS — I10 Essential (primary) hypertension: Secondary | ICD-10-CM

## 2022-08-06 DIAGNOSIS — E1122 Type 2 diabetes mellitus with diabetic chronic kidney disease: Secondary | ICD-10-CM

## 2022-08-06 DIAGNOSIS — Z23 Encounter for immunization: Secondary | ICD-10-CM

## 2022-08-06 DIAGNOSIS — Z0000 Encounter for general adult medical examination without abnormal findings: Secondary | ICD-10-CM

## 2022-08-06 LAB — POCT GLYCOSYLATED HEMOGLOBIN (HGB A1C): Hemoglobin A1C: 7.4 % — AB (ref 4.0–5.6)

## 2022-08-06 MED ORDER — HYDROCHLOROTHIAZIDE 12.5 MG PO CAPS: 12.5000 mg | ORAL_CAPSULE | Freq: Every day | ORAL | 3 refills | Status: DC

## 2022-08-06 MED ORDER — AMLODIPINE BESYLATE 10 MG PO TABS: 10.0000 mg | ORAL_TABLET | Freq: Every day | ORAL | 3 refills | Status: DC

## 2022-08-06 MED ORDER — CARVEDILOL 25 MG PO TABS: 25.0000 mg | ORAL_TABLET | Freq: Two times a day (BID) | ORAL | 3 refills | Status: DC

## 2022-08-06 MED ORDER — ALLOPURINOL 100 MG PO TABS: 400.0000 mg | ORAL_TABLET | Freq: Every day | ORAL | 1 refills | Status: DC

## 2022-08-06 MED ORDER — METFORMIN HCL ER 750 MG PO TB24: 750.0000 mg | ORAL_TABLET | Freq: Every day | ORAL | 3 refills | Status: DC

## 2022-08-06 MED ORDER — BENAZEPRIL HCL 40 MG PO TABS: 40.0000 mg | ORAL_TABLET | Freq: Every day | ORAL | 3 refills | Status: DC

## 2022-08-06 NOTE — Assessment & Plan Note (Signed)
Thomas Leonard returns, he is a pleasant 76 year old male, he was on metformin and glipizide with a controlled A1c at 7%, we did order his diabetic eye exam, he was up-to-date on other diabetic screenings. He told me at the last visit that he did not think metformin was do anything for him and he wanted to stop it and go to an over-the-counter supplement. I told him his A1c would worsen, it did worsen to 7.4%, and he is agreeable to restart his metformin, at this time we will do an extended release formulation to decrease adverse effects.

## 2022-08-06 NOTE — Assessment & Plan Note (Signed)
Just got his Shingrix, he is agreeable to do pneumococcal vaccination, I do not see that he has had one in the past so we will do Prevnar 20.

## 2022-08-06 NOTE — Progress Notes (Signed)
    Procedures performed today:    None.  Independent interpretation of notes and tests performed by another provider:   None.  Brief History, Exam, Impression, and Recommendations:    Type 2 diabetes mellitus with diabetic chronic kidney disease (HCC) Deaundra returns, he is a pleasant 76 year old male, he was on metformin and glipizide with a controlled A1c at 7%, we did order his diabetic eye exam, he was up-to-date on other diabetic screenings. He told me at the last visit that he did not think metformin was do anything for him and he wanted to stop it and go to an over-the-counter supplement. I told him his A1c would worsen, it did worsen to 7.4%, and he is agreeable to restart his metformin, at this time we will do an extended release formulation to decrease adverse effects.   Annual physical exam Just got his Shingrix, he is agreeable to do pneumococcal vaccination, I do not see that he has had one in the past so we will do Prevnar 20.    ____________________________________________ Gwen Her. Dianah Field, M.D., ABFM., CAQSM., AME. Primary Care and Sports Medicine [place] [facility]  Adjunct Professor of [place] [employer] [facility] of Medicine  Risk manager

## 2022-08-13 DIAGNOSIS — H524 Presbyopia: Secondary | ICD-10-CM | POA: Diagnosis not present

## 2022-08-13 DIAGNOSIS — E119 Type 2 diabetes mellitus without complications: Secondary | ICD-10-CM | POA: Diagnosis not present

## 2022-10-23 ENCOUNTER — Other Ambulatory Visit: Payer: Self-pay

## 2022-11-06 ENCOUNTER — Ambulatory Visit (INDEPENDENT_AMBULATORY_CARE_PROVIDER_SITE_OTHER): Payer: Medicare HMO

## 2022-11-06 ENCOUNTER — Encounter: Payer: Self-pay | Admitting: Sports Medicine

## 2022-11-06 ENCOUNTER — Ambulatory Visit (INDEPENDENT_AMBULATORY_CARE_PROVIDER_SITE_OTHER): Payer: Medicare HMO | Admitting: Sports Medicine

## 2022-11-06 VITALS — BP 155/78 | HR 56

## 2022-11-06 DIAGNOSIS — M47816 Spondylosis without myelopathy or radiculopathy, lumbar region: Secondary | ICD-10-CM | POA: Diagnosis not present

## 2022-11-06 DIAGNOSIS — I10 Essential (primary) hypertension: Secondary | ICD-10-CM

## 2022-11-06 DIAGNOSIS — M48061 Spinal stenosis, lumbar region without neurogenic claudication: Secondary | ICD-10-CM | POA: Diagnosis not present

## 2022-11-06 DIAGNOSIS — M5136 Other intervertebral disc degeneration, lumbar region: Secondary | ICD-10-CM | POA: Diagnosis not present

## 2022-11-06 DIAGNOSIS — E1122 Type 2 diabetes mellitus with diabetic chronic kidney disease: Secondary | ICD-10-CM

## 2022-11-06 DIAGNOSIS — Z0000 Encounter for general adult medical examination without abnormal findings: Secondary | ICD-10-CM | POA: Diagnosis not present

## 2022-11-06 DIAGNOSIS — N182 Chronic kidney disease, stage 2 (mild): Secondary | ICD-10-CM

## 2022-11-06 DIAGNOSIS — M545 Low back pain, unspecified: Secondary | ICD-10-CM | POA: Diagnosis not present

## 2022-11-06 LAB — POCT UA - MICROALBUMIN
Creatinine, POC: 50 mg/dL
Microalbumin Ur, POC: 10 mg/L

## 2022-11-06 MED ORDER — PREDNISONE 50 MG PO TABS: ORAL_TABLET | ORAL | 0 refills | Status: DC

## 2022-11-06 MED ORDER — HYDROCHLOROTHIAZIDE 25 MG PO TABS: 25.0000 mg | ORAL_TABLET | Freq: Every day | ORAL | 3 refills | Status: DC

## 2022-11-06 NOTE — Assessment & Plan Note (Signed)
Blood pressure is elevated today. Currently taking amlodipine 10, benazepril 40, hydrochlorothiazide 12.5. Will increase HCTZ to 25 mg. He will return in 2 weeks for nurse visit blood pressure check.

## 2022-11-06 NOTE — Addendum Note (Signed)
Addended by: Tarri Glenn A on: 11/06/2022 10:34 AM   Modules accepted: Orders

## 2022-11-06 NOTE — Assessment & Plan Note (Signed)
Has not yet gotten Shingrix vaccination. He plans to, up-to-date on other screenings.

## 2022-11-06 NOTE — Assessment & Plan Note (Addendum)
Rechecking hemoglobin A1c.   Abnormal urine microalbumin creatinine ratio today.   Currently on an ACE inhibitor. Not taking any nephrotoxic medications, checking his CMP with GFR today, we will watch for now.  Update: A1c uncontrolled, increase glipizide to 10 mg twice daily, continue metformin 750, recheck A1c in 3 months.

## 2022-11-06 NOTE — Assessment & Plan Note (Signed)
Axial back pain worse with standing, radicular symptoms down the left leg to the lateral ankle but not to the foot. Adding prednisone, x-rays, formal PT, return in 6 weeks, MR for interventional planning if no better.

## 2022-11-06 NOTE — Progress Notes (Addendum)
    Procedures performed today:    None.  Independent interpretation of notes and tests performed by another provider:   None.  Brief History, Exam, Impression, and Recommendations:    Type 2 diabetes mellitus with diabetic chronic kidney disease (HCC) Rechecking hemoglobin A1c.   Abnormal urine microalbumin creatinine ratio today.   Currently on an ACE inhibitor. Not taking any nephrotoxic medications, checking his CMP with GFR today, we will watch for now.  Update: A1c uncontrolled, increase glipizide to 10 mg twice daily, continue metformin 750, recheck A1c in 3 months.  Annual physical exam Has not yet gotten Shingrix vaccination. He plans to, up-to-date on other screenings.  Lumbar spinal stenosis Axial back pain worse with standing, radicular symptoms down the left leg to the lateral ankle but not to the foot. Adding prednisone, x-rays, formal PT, return in 6 weeks, MR for interventional planning if no better.  Benign essential hypertension Blood pressure is elevated today. Currently taking amlodipine 10, benazepril 40, hydrochlorothiazide 12.5. Will increase HCTZ to 25 mg. He will return in 2 weeks for nurse visit blood pressure check.    ____________________________________________ Gwen Her. Dianah Field, M.D., ABFM., CAQSM., AME. Primary Care and Sports Medicine  [facility]  Adjunct Professor of [place] [employer] [facility] of Medicine  Risk manager

## 2022-11-07 LAB — COMPLETE METABOLIC PANEL WITH GFR
AG Ratio: 1.8 (calc) (ref 1.0–2.5)
ALT: 56 U/L — ABNORMAL HIGH (ref 9–46)
AST: 32 U/L (ref 10–35)
Albumin: 4.4 g/dL (ref 3.6–5.1)
Alkaline phosphatase (APISO): 63 U/L (ref 35–144)
BUN/Creatinine Ratio: 26 (calc) — ABNORMAL HIGH (ref 6–22)
BUN: 29 mg/dL — ABNORMAL HIGH (ref 7–25)
CO2: 28 mmol/L (ref 20–32)
Calcium: 9.7 mg/dL (ref 8.6–10.3)
Chloride: 101 mmol/L (ref 98–110)
Creat: 1.12 mg/dL (ref 0.70–1.28)
Globulin: 2.5 g/dL (calc) (ref 1.9–3.7)
Glucose, Bld: 334 mg/dL — ABNORMAL HIGH (ref 65–99)
Potassium: 4.4 mmol/L (ref 3.5–5.3)
Sodium: 139 mmol/L (ref 135–146)
Total Bilirubin: 0.7 mg/dL (ref 0.2–1.2)
Total Protein: 6.9 g/dL (ref 6.1–8.1)
eGFR: 68 mL/min/{1.73_m2} (ref >=60)

## 2022-11-07 LAB — CBC
HCT: 41.6 % (ref 38.5–50.0)
Hemoglobin: 14.3 g/dL (ref 13.2–17.1)
MCH: 32.8 pg (ref 27.0–33.0)
MCHC: 34.4 g/dL (ref 32.0–36.0)
MCV: 95.4 fL (ref 80.0–100.0)
MPV: 11 fL (ref 7.5–12.5)
Platelets: 183 10*3/uL (ref 140–400)
RBC: 4.36 10*6/uL (ref 4.20–5.80)
RDW: 12.6 % (ref 11.0–15.0)
WBC: 6.8 10*3/uL (ref 3.8–10.8)

## 2022-11-07 LAB — LIPID PANEL
Cholesterol: 133 mg/dL (ref <200)
HDL: 30 mg/dL — ABNORMAL LOW (ref >=40)
LDL Cholesterol (Calc): 70 mg/dL (calc)
Non-HDL Cholesterol (Calc): 103 mg/dL (calc) (ref <130)
Total CHOL/HDL Ratio: 4.4 (calc) (ref <5.0)
Triglycerides: 254 mg/dL — ABNORMAL HIGH (ref <150)

## 2022-11-07 LAB — TSH: TSH: 3.57 mIU/L (ref 0.40–4.50)

## 2022-11-07 LAB — HEMOGLOBIN A1C
Hgb A1c MFr Bld: 8.3 % of total Hgb — ABNORMAL HIGH (ref <5.7)
Mean Plasma Glucose: 192 mg/dL
eAG (mmol/L): 10.6 mmol/L

## 2022-11-07 MED ORDER — GLIPIZIDE 10 MG PO TABS: 10.0000 mg | ORAL_TABLET | Freq: Two times a day (BID) | ORAL | 3 refills | Status: DC

## 2022-11-07 NOTE — Addendum Note (Signed)
Addended by: Silverio Decamp on: 11/07/2022 08:51 AM   Modules accepted: Orders

## 2022-11-11 ENCOUNTER — Ambulatory Visit: Payer: Medicare HMO | Attending: Sports Medicine | Admitting: Physical Therapy

## 2022-11-11 ENCOUNTER — Other Ambulatory Visit: Payer: Self-pay

## 2022-11-11 ENCOUNTER — Encounter: Payer: Self-pay | Admitting: Physical Therapy

## 2022-11-11 DIAGNOSIS — R29898 Other symptoms and signs involving the musculoskeletal system: Secondary | ICD-10-CM

## 2022-11-11 DIAGNOSIS — M48061 Spinal stenosis, lumbar region without neurogenic claudication: Secondary | ICD-10-CM | POA: Diagnosis not present

## 2022-11-11 DIAGNOSIS — M6281 Muscle weakness (generalized): Secondary | ICD-10-CM | POA: Insufficient documentation

## 2022-11-11 NOTE — Therapy (Signed)
OUTPATIENT PHYSICAL THERAPY THORACOLUMBAR EVALUATION   Patient Name: Thomas Leonard MRN: [id number] DOB:15-Nov-1945, 77 y.o., male Today's Date: 11/11/2022  END OF SESSION:  PT End of Session - 11/11/22 0321     Visit Number 1    Number of Visits 6    Date for PT Re-Evaluation 12/23/22    Authorization Type [employer]    Authorization - Visit Number 1    Progress Note Due on Visit 10    PT Start Time 0848    PT Stop Time 0920    PT Time Calculation (min) 32 min    Activity Tolerance Patient tolerated treatment well    Behavior During Therapy [facility] for tasks assessed/performed             Past Medical History:  Diagnosis Date   Arthritis    10 yrs ago   Cataract    4 yrs ago having surgery on noth eyes on November 2 snd 16th   Chronic kidney disease    Diabetes mellitus without complication ([place])    GERD (gastroesophageal reflux disease) 8 yrs ago   Gout    Hyperlipidemia    Hypertension    Past Surgical History:  Procedure Laterality Date   CHOLECYSTECTOMY  February 2021   EYE SURGERY  08/21/2021   Patient Active Problem List   Diagnosis Date Noted   Lumbar spinal stenosis 11/06/2022   Localized swelling of left lower extremity 10/21/2021   Annual physical exam 06/20/2021   Gout 06/20/2021   Type 2 diabetes mellitus with diabetic chronic kidney disease ([place]) 06/20/2021   Hemochromatosis 06/20/2021   Benign essential hypertension 06/20/2021   Mixed hyperlipidemia 06/20/2021   GERD (gastroesophageal reflux disease) 06/20/2021   Obesity 06/20/2021    PCP: Dianah Field  REFERRING PROVIDER: Dianah Field  REFERRING DIAG: Lumbar spinal stenosis  Rationale for Evaluation and Treatment: Rehabilitation  THERAPY DIAG:  Muscle weakness (generalized)  Other symptoms and signs involving the musculoskeletal system  ONSET DATE: 11/04/22  SUBJECTIVE:                                                                                                                                                                                            SUBJECTIVE STATEMENT: Pt had onset of back pain with pain traveling down Lt LE about 1 week ago. He went to MD who prescribed prednisone and pt states the pain in his leg is gone. He still has some stiffness in low back.  PERTINENT HISTORY:  Diabetes, high cholesterol  PAIN:  Are you having pain? Yes: NPRS scale: 2/10 Pain location: low back Pain description: dull Aggravating factors: heavy lifting,  yard work Relieving factors: rest, ice/heat  PRECAUTIONS: None  WEIGHT BEARING RESTRICTIONS: No  FALLS:  Has patient fallen in last 6 months? No   OCCUPATION: retired, swims at the [employer]  PLOF: Independent  PATIENT GOALS: reduce back pain and stiffness  NEXT MD VISIT:   OBJECTIVE:   DIAGNOSTIC FINDINGS:  X Ray: Moderate multilevel lumbar degenerative disc disease and moderate bilateral lower lumbar facet arthropathy. 2. Mild levocurvature of the lumbar spine. 3. Mild multilevel lumbar spondylolisthesis    MUSCLE LENGTH: Hamstrings:  Left decreased vs Rt  POSTURE: flexed trunk   PALPATION: No TTP lumbar paraspinals, lumbar spine with CPAs, UPAs, SIJ  LUMBAR ROM:   AROM eval  Flexion WFL  Extension WFL  Right lateral flexion WFL  Left lateral flexion WFL  Right rotation WFL  Left rotation WFL   (Blank rows = not tested)  LOWER EXTREMITY MMT:     MMT Right eval Left eval  Hip flexion 4+ 4+  Hip extension 4 4  Hip abduction 3+ 3+  Hip adduction    Hip internal rotation    Hip external rotation    Knee flexion    Knee extension    Ankle dorsiflexion    Ankle plantarflexion    Ankle inversion    Ankle eversion     (Blank rows = not tested)   TODAY'S TREATMENT:                                                                                                                              DATE: 11/11/22 See HEP    PATIENT EDUCATION:  Education details: PT POC and goals,  HEP Person educated: Patient Education method: Explanation, Demonstration, and Handouts Education comprehension: verbalized understanding and returned demonstration  HOME EXERCISE PROGRAM: Access Code: [email or web address] URL: [email or web address] Date: 11/11/2022 Prepared by: Isabelle Course  Exercises - Hip Abduction Machine  - 1 x daily - 2-3 x weekly - 3 sets - 10 reps - Standing Cable Hip Abduction  - 1 x daily - 2-3 x weekly - 3 sets - 10 reps - Standing Cable Hip Extension  - 1 x daily - 2-3 x weekly - 3 sets - 10 reps - Standing Cable Chops  - 1 x daily - 2-3 x weekly - 3 sets - 10 reps - Standing Cable Lifts  - 1 x daily - 2-3 x weekly - 3 sets - 10 reps - Supine Lower Trunk Rotation  - 1 x daily - 7 x weekly - 1 sets - 10 reps - 10 seconds hold - Seated Hamstring Stretch  - 1 x daily - 7 x weekly - 1 sets - 3 reps - 20-30 seconds hold - Seated Hip Flexor Stretch  - 1 x daily - 7 x weekly - 1 sets - 3 reps - 20-30 seconds hold  ASSESSMENT:  CLINICAL IMPRESSION: Patient is a 77 y.o. male who was seen today for physical therapy evaluation and treatment  for lumbar spinal stenosis. Pt presents with decreased strength and flexibility and will benefit from skilled PT to address deficits and improve functional strength and mobility.   OBJECTIVE IMPAIRMENTS: decreased activity tolerance, decreased strength, impaired flexibility, and pain.   ACTIVITY LIMITATIONS: carrying and lifting  PARTICIPATION LIMITATIONS: yard work  PERSONAL FACTORS: Age are also affecting patient's functional outcome.   REHAB POTENTIAL: Good  CLINICAL DECISION MAKING: Stable/uncomplicated  EVALUATION COMPLEXITY: Low   GOALS: Goals reviewed with patient? Yes  SHORT TERM GOALS: Target date: 11/25/2022    Patient will be independent with initial HEP Baseline: Goal status: INITIAL   LONG TERM GOALS: Target date: 12/23/2022    Pt will be independent with advanced HEP Baseline:  Goal  status: INITIAL  2.  Pt will improve LE strength to 4+/5 to improve activity tolerance Baseline:  Goal status: INITIAL  3.  Pt will perform yard work with pain <= 1/10 Baseline:  Goal status: INITIAL   PLAN:  PT FREQUENCY: 1x/week  PT DURATION: 6 weeks  PLANNED INTERVENTIONS: Therapeutic exercises, Therapeutic activity, Neuromuscular re-education, Balance training, Gait training, Patient/Family education, Self Care, Joint mobilization, Aquatic Therapy, Dry Needling, Electrical stimulation, Cryotherapy, Moist heat, Taping, Traction, Ultrasound, Manual therapy, and Re-evaluation.  PLAN FOR NEXT SESSION: assess and progress HEP, core and hip strength   Sai Zinn, PT 11/11/2022, 9:23 AM

## 2022-11-19 ENCOUNTER — Ambulatory Visit (INDEPENDENT_AMBULATORY_CARE_PROVIDER_SITE_OTHER): Payer: Medicare HMO | Admitting: Sports Medicine

## 2022-11-19 VITALS — BP 116/62 | HR 60

## 2022-11-19 DIAGNOSIS — I10 Essential (primary) hypertension: Secondary | ICD-10-CM

## 2022-11-19 NOTE — Progress Notes (Signed)
   Established Patient Office Visit  Subjective   Patient ID: Thomas Leonard, male    DOB: 08/08/1946  Age: 77 y.o. MRN: [id number]  Chief Complaint  Patient presents with   Hypertension    HPI  Thomas Leonard is here for blood pressure check. Denies chest pain, shortness of breath or dizziness.   ROS    Objective:     BP 116/62   Pulse 60   SpO2 97%    Physical Exam   No results found for any visits on 11/19/22.    The 10-year ASCVD risk score (Arnett DK, et al., 2019) is: 45.2%    Assessment & Plan:  Hypertension - blood pressure within normal limits. Patient advised to continue current medications as directed.    Problem List Items Addressed This Visit       Unprioritized   Benign essential hypertension - Primary    Return in about 3 months (around 02/17/2023) for Hypertension with Dr Dianah Field..    Durene Romans, Monico Blitz, [place]

## 2022-11-20 ENCOUNTER — Ambulatory Visit: Payer: Medicare HMO

## 2022-12-18 ENCOUNTER — Ambulatory Visit (INDEPENDENT_AMBULATORY_CARE_PROVIDER_SITE_OTHER): Payer: Medicare HMO | Admitting: Sports Medicine

## 2022-12-18 DIAGNOSIS — M48061 Spinal stenosis, lumbar region without neurogenic claudication: Secondary | ICD-10-CM

## 2022-12-18 DIAGNOSIS — E1122 Type 2 diabetes mellitus with diabetic chronic kidney disease: Secondary | ICD-10-CM

## 2022-12-18 DIAGNOSIS — N182 Chronic kidney disease, stage 2 (mild): Secondary | ICD-10-CM | POA: Diagnosis not present

## 2022-12-18 MED ORDER — METFORMIN HCL 500 MG PO TABS: 500.0000 mg | ORAL_TABLET | Freq: Two times a day (BID) | ORAL | 3 refills | Status: DC

## 2022-12-18 NOTE — Progress Notes (Signed)
    Procedures performed today:    None.  Independent interpretation of notes and tests performed by another provider:   None.  Brief History, Exam, Impression, and Recommendations:    Lumbar spinal stenosis Axial back pain that was worse with standing and radicular symptoms down the left leg to the lateral ankle are much better after prednisone, x-rays, therapy. Return as needed for this  Type 2 diabetes mellitus with diabetic chronic kidney disease ([place]) Thomas Leonard switched back to metformin 500 twice daily about a month ago, I am happy to continue this but we need at least 3 months on a stable regimen before checking his A1c. Continue glipizide 10 twice daily. If A1c is uncontrolled at the follow-up visit we will consider the addition of an SGLT2 inhibitor or DPP 4 inhibitor.    ____________________________________________ Gwen Her. Dianah Field, M.D., ABFM., CAQSM., AME. Primary Care and Sports Medicine  [facility]  Adjunct Professor of [place] [employer] [facility] [employer]  Risk manager

## 2022-12-18 NOTE — Assessment & Plan Note (Signed)
Axial back pain that was worse with standing and radicular symptoms down the left leg to the lateral ankle are much better after prednisone, x-rays, therapy. Return as needed for this

## 2022-12-18 NOTE — Assessment & Plan Note (Signed)
Thomas Leonard switched back to metformin 500 twice daily about a month ago, I am happy to continue this but we need at least 3 months on a stable regimen before checking his A1c. Continue glipizide 10 twice daily. If A1c is uncontrolled at the follow-up visit we will consider the addition of an SGLT2 inhibitor or DPP 4 inhibitor.

## 2023-01-06 IMAGING — US US ABDOMEN COMPLETE
1 series · 13 of 25 positions shown · non-contrast
Comparison: None.

CLINICAL DATA: History of hereditary hemochromatosis and prior
cholecystectomy.

EXAM:
ABDOMEN ULTRASOUND COMPLETE

[Series 1: us abdomen complete · 13 of 80 slices shown]
[im 1/80]
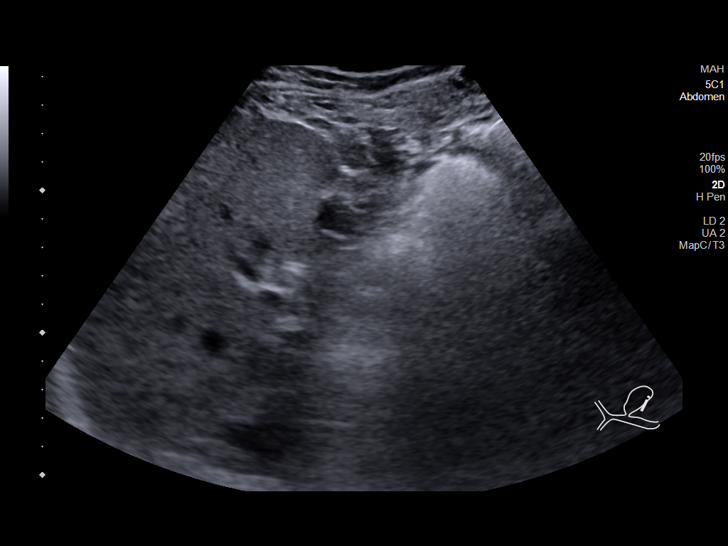
[im 7/80]
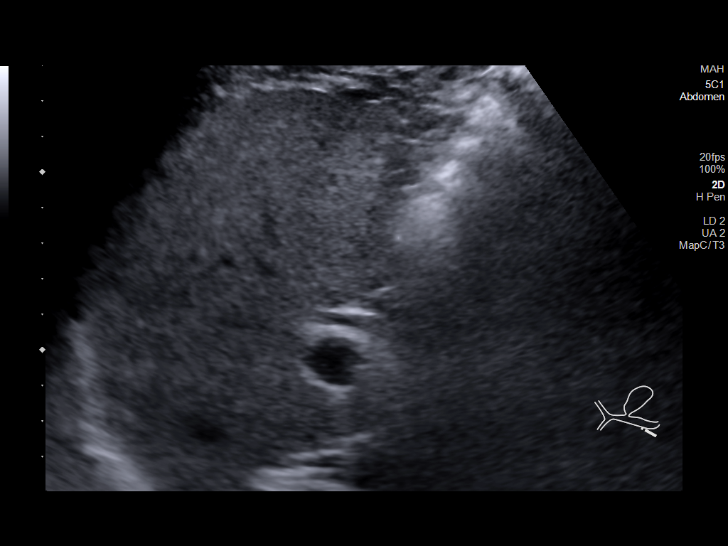
[im 14/80]
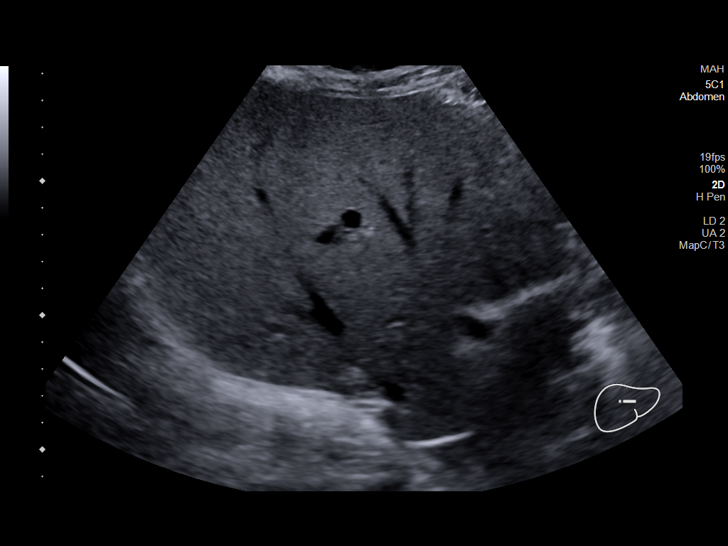
[im 20/80]
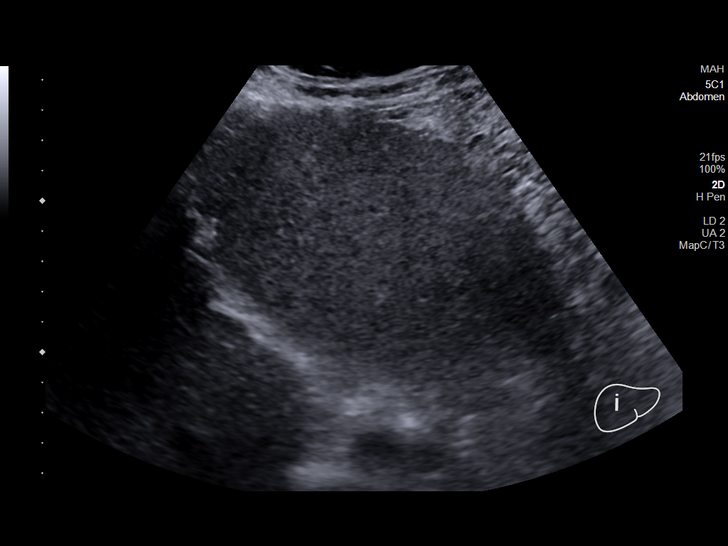
[im 27/80]
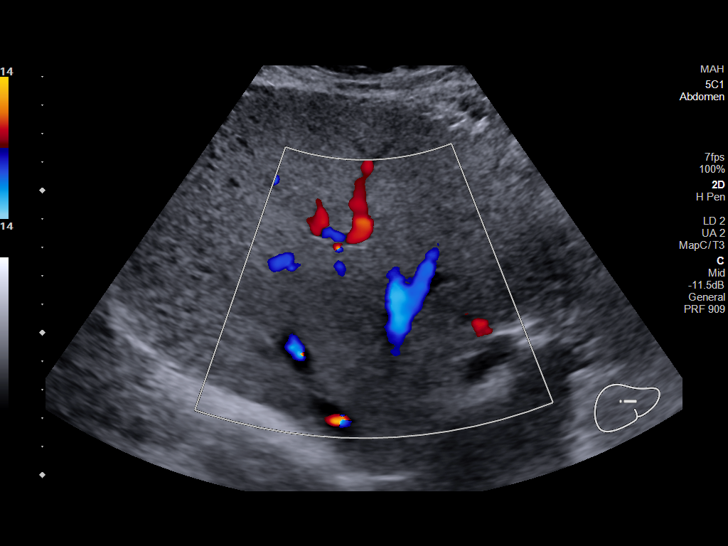
[im 33/80]
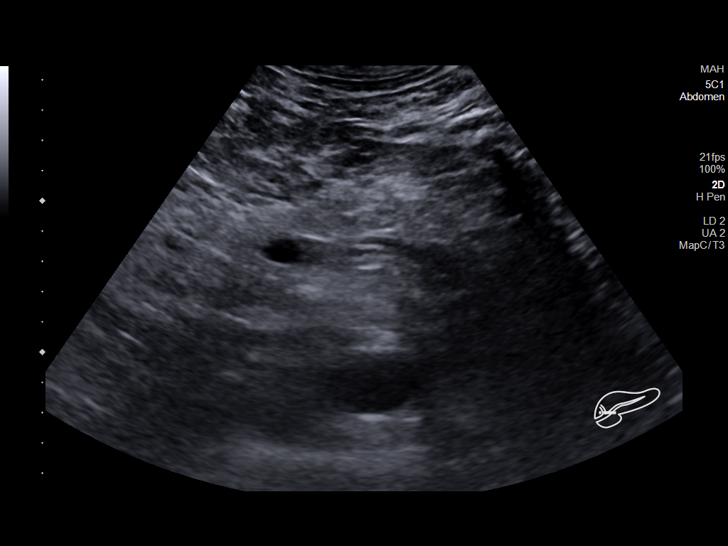
[im 40/80]
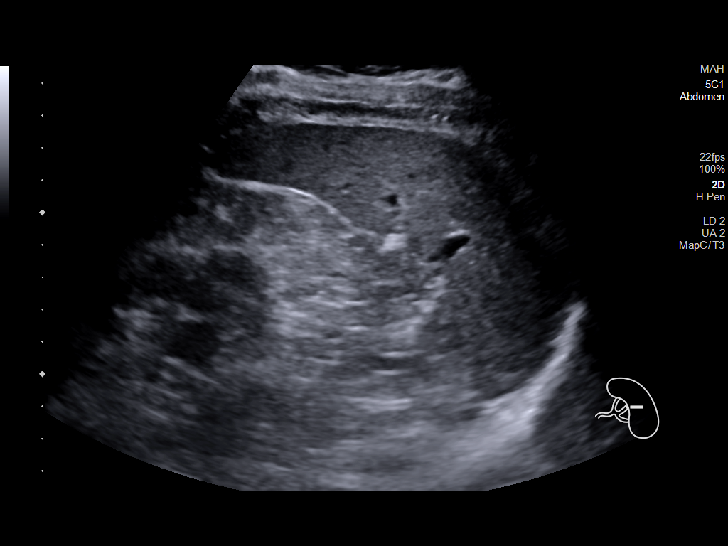
[im 47/80]
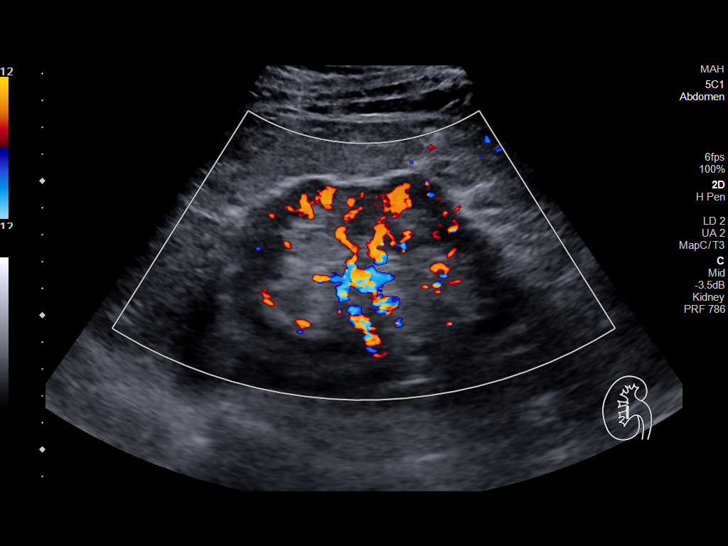
[im 53/80]
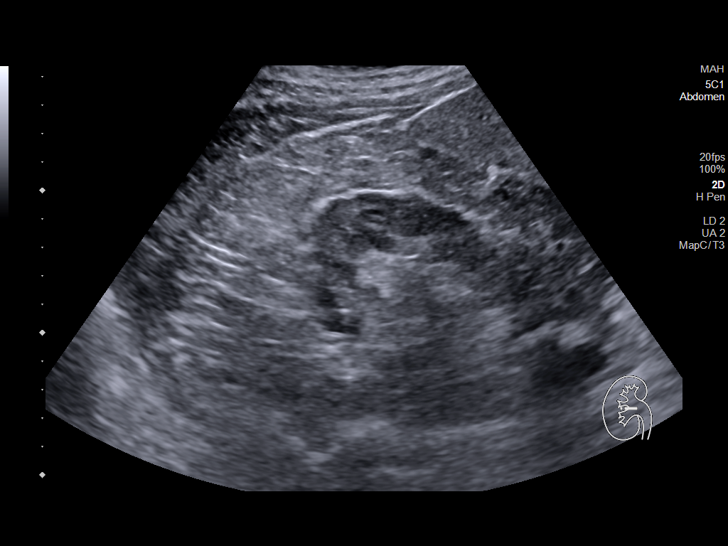
[im 60/80]
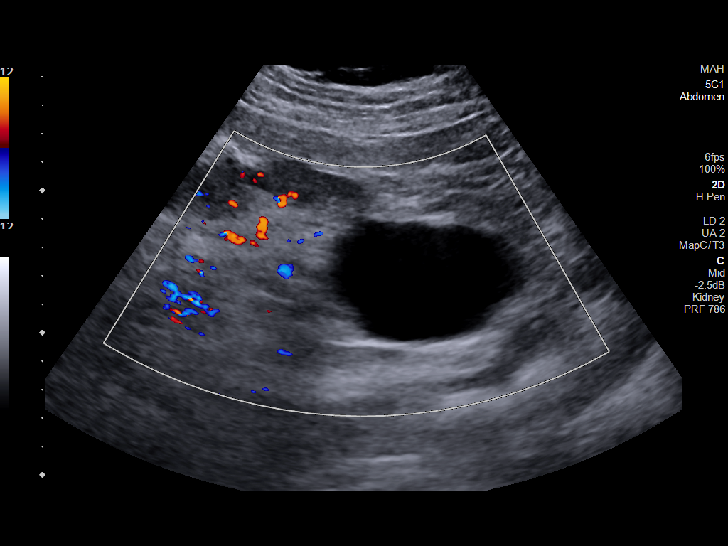
[im 66/80]
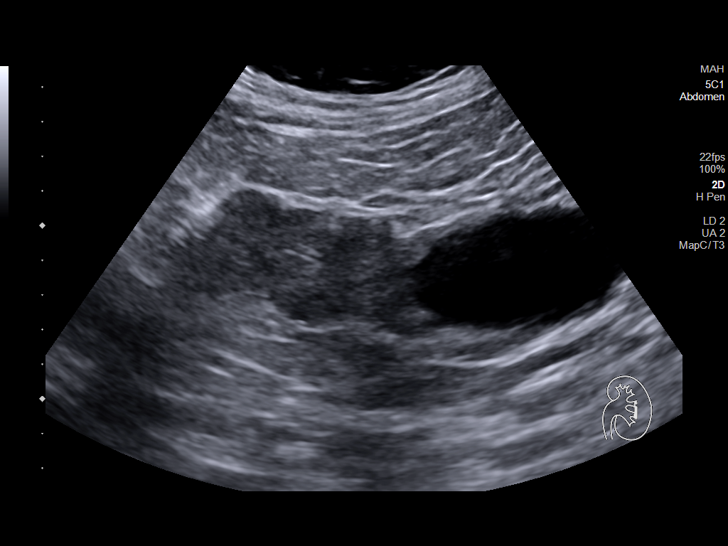
[im 73/80]
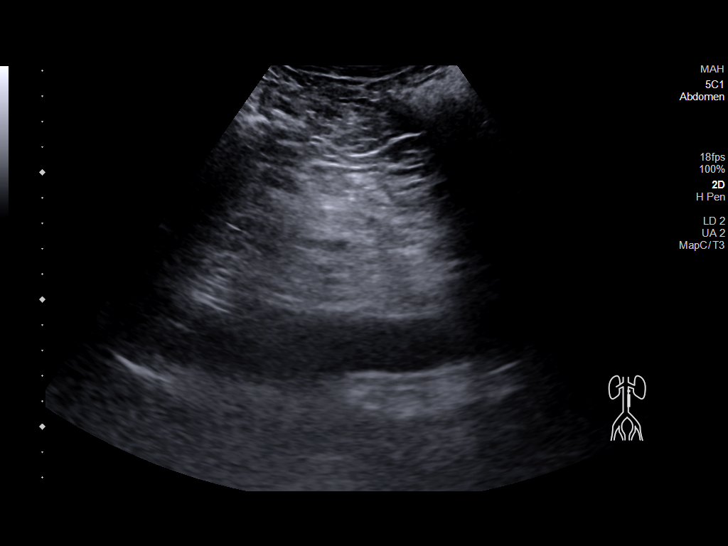
[im 80/80]
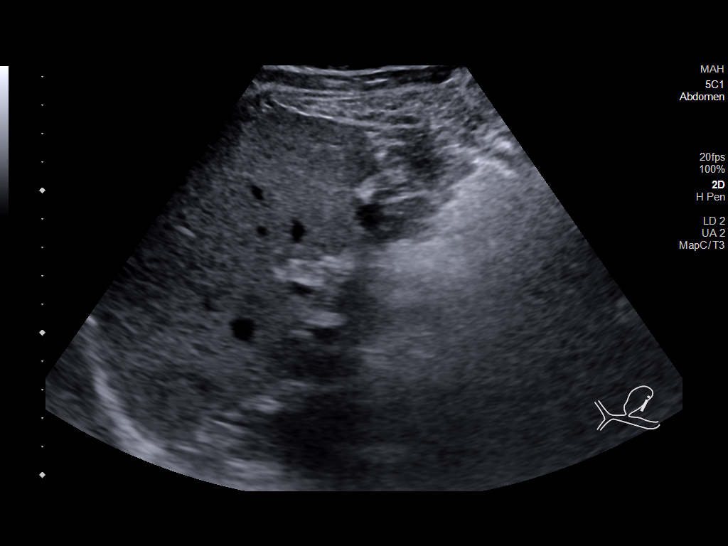

[13 of 25 positions shown; findings below may reference images not displayed]

FINDINGS: Gallbladder: The gallbladder is surgically absent.

Common bile duct: Diameter: 3.5 mm

Liver: No focal lesion identified. Diffusely increased echogenicity
of the liver parenchyma is noted. Portal vein is patent on color
Doppler imaging with normal direction of blood flow towards the
liver.

IVC: No abnormality visualized.

Pancreas: Visualized portion unremarkable.

Spleen: Size (8.9 cm) and appearance within normal limits.

Right Kidney: Length: 12.0 cm. Echogenicity within normal limits. No
mass or hydronephrosis visualized.

Left Kidney: Length: 12.5 cm. Echogenicity within normal limits. A
6.5 cm x 4.4 cm x 5.9 cm anechoic structure is seen within the lower
pole of the left kidney. No abnormal flow is seen within this region
on color Doppler evaluation. No hydronephrosis is visualized.

Abdominal aorta: No aneurysm visualized (2.5 cm in AP diameter).

Other findings: None.
IMPRESSION: 1. Findings consistent with history of prior cholecystectomy.
2. Hepatic steatosis.
3. Large simple left renal cyst. No additional follow-up is
recommended.

## 2023-01-12 ENCOUNTER — Encounter: Payer: Self-pay | Admitting: Sports Medicine

## 2023-01-12 DIAGNOSIS — I10 Essential (primary) hypertension: Secondary | ICD-10-CM

## 2023-01-12 MED ORDER — AMLODIPINE BESYLATE 10 MG PO TABS: 10.0000 mg | ORAL_TABLET | Freq: Every day | ORAL | 0 refills | Status: DC

## 2023-01-29 ENCOUNTER — Encounter: Payer: Self-pay | Admitting: Medical Oncology

## 2023-01-29 ENCOUNTER — Inpatient Hospital Stay: Payer: Medicare HMO | Admitting: Medical Oncology

## 2023-01-29 ENCOUNTER — Inpatient Hospital Stay: Payer: Medicare HMO | Attending: Hematology & Oncology

## 2023-01-29 ENCOUNTER — Other Ambulatory Visit: Payer: Self-pay

## 2023-01-29 LAB — CMP (CANCER CENTER ONLY)
ALT: 40 U/L (ref 0–44)
AST: 25 U/L (ref 15–41)
Albumin: 4.4 g/dL (ref 3.5–5.0)
Alkaline Phosphatase: 59 U/L (ref 38–126)
Anion gap: 10 (ref 5–15)
BUN: 34 mg/dL — ABNORMAL HIGH (ref 8–23)
CO2: 25 mmol/L (ref 22–32)
Calcium: 9.3 mg/dL (ref 8.9–10.3)
Chloride: 106 mmol/L (ref 98–111)
Creatinine: 1.26 mg/dL — ABNORMAL HIGH (ref 0.61–1.24)
GFR, Estimated: 59 mL/min — ABNORMAL LOW (ref >60)
Glucose, Bld: 222 mg/dL — ABNORMAL HIGH (ref 70–99)
Potassium: 4 mmol/L (ref 3.5–5.1)
Sodium: 141 mmol/L (ref 135–145)
Total Bilirubin: 0.7 mg/dL (ref 0.3–1.2)
Total Protein: 7 g/dL (ref 6.5–8.1)

## 2023-01-29 LAB — CBC WITH DIFFERENTIAL/PLATELET
Abs Immature Granulocytes: 0.02 10*3/uL (ref 0.00–0.07)
Basophils Absolute: 0 10*3/uL (ref 0.0–0.1)
Basophils Relative: 1 %
Eosinophils Absolute: 0.2 10*3/uL (ref 0.0–0.5)
Eosinophils Relative: 3 %
HCT: 39.7 % (ref 39.0–52.0)
Hemoglobin: 13.6 g/dL (ref 13.0–17.0)
Immature Granulocytes: 0 %
Lymphocytes Relative: 22 %
Lymphs Abs: 1.5 10*3/uL (ref 0.7–4.0)
MCH: 33.2 pg (ref 26.0–34.0)
MCHC: 34.3 g/dL (ref 30.0–36.0)
MCV: 96.8 fL (ref 80.0–100.0)
Monocytes Absolute: 0.6 10*3/uL (ref 0.1–1.0)
Monocytes Relative: 9 %
Neutro Abs: 4.7 10*3/uL (ref 1.7–7.7)
Neutrophils Relative %: 65 %
Platelets: 163 10*3/uL (ref 150–400)
RBC: 4.1 MIL/uL — ABNORMAL LOW (ref 4.22–5.81)
RDW: 13.3 % (ref 11.5–15.5)
WBC: 7.1 10*3/uL (ref 4.0–10.5)
nRBC: 0 % (ref 0.0–0.2)

## 2023-01-29 LAB — IRON AND IRON BINDING CAPACITY (CC-WL,HP ONLY)
Iron: 88 ug/dL (ref 45–182)
Saturation Ratios: 26 % (ref 17.9–39.5)
TIBC: 342 ug/dL (ref 250–450)
UIBC: 254 ug/dL (ref 117–376)

## 2023-01-29 LAB — FERRITIN: Ferritin: 206 ng/mL (ref 24–336)

## 2023-01-29 NOTE — Progress Notes (Signed)
Hematology and Oncology Follow Up Visit  Thomas Leonard [id number] 1946-02-22 77 y.o. 01/29/2023   Principle Diagnosis:  Hereditary hemochromatosis- C282Y -heterozygous  Current Therapy:   Phlebotomy to maintain ferritin less than 100 and iron saturation less than 30%     Interim History:  Thomas Leonard is back for follow-up.  This is his fourth office visit.  We found that he is heterozygous for one of the major mutations- C282Y.  At his last visit his ferritin was 139 with iron saturation of 24%.  In the past he elects to donate his blood when his counts are elevated.   No headaches, fatigue, bowel changes, skin problems.   Overall, his performance status is ECOG 0.  Wt Readings from Last 3 Encounters:  01/29/23 216 lb 3.2 oz (98.1 kg)  08/06/22 218 lb (98.9 kg)  07/31/22 215 lb (97.5 kg)     Medications:  Current Outpatient Medications:    ACCU-CHEK AVIVA PLUS test strip, 1 each by Other route daily., Disp: 300 each, Rfl: 1   Accu-Chek Softclix Lancets lancets, 1 each by Other route daily., Disp: 300 each, Rfl: 1   Alcohol Swabs (B-D SINGLE USE SWABS REGULAR) PADS, 1 each by Does not apply route daily., Disp: 300 each, Rfl: 1   allopurinol (ZYLOPRIM) 100 MG tablet, Take 4 tablets (400 mg total) by mouth daily., Disp: 360 tablet, Rfl: 1   amLODipine (NORVASC) 10 MG tablet, Take 1 tablet (10 mg total) by mouth daily., Disp: 30 tablet, Rfl: 0   aspirin 81 MG chewable tablet, Chew 81 mg by mouth daily., Disp: , Rfl:    atorvastatin (LIPITOR) 10 MG tablet, TAKE 1 TABLET EVERY DAY, Disp: 90 tablet, Rfl: 1   benazepril (LOTENSIN) 40 MG tablet, Take 1 tablet (40 mg total) by mouth daily., Disp: 90 tablet, Rfl: 3   carvedilol (COREG) 25 MG tablet, Take 1 tablet (25 mg total) by mouth 2 (two) times daily with a meal., Disp: 180 tablet, Rfl: 3   Cholecalciferol (VITAMIN D3) 25 MCG (1000 UT) CAPS, Take 2 capsules by mouth daily., Disp: , Rfl:    esomeprazole (NEXIUM) 20 MG capsule,  Take 20 mg by mouth daily at 12 noon., Disp: , Rfl:    glipiZIDE (GLUCOTROL) 10 MG tablet, Take 1 tablet (10 mg total) by mouth 2 (two) times daily before a meal., Disp: 180 tablet, Rfl: 3   hydrochlorothiazide (HYDRODIURIL) 25 MG tablet, Take 1 tablet (25 mg total) by mouth daily., Disp: 90 tablet, Rfl: 3   metFORMIN (GLUCOPHAGE) 500 MG tablet, Take 1 tablet (500 mg total) by mouth 2 (two) times daily with a meal., Disp: 180 tablet, Rfl: 3   Omega-3 Fatty Acids (FISH OIL) 1000 MG CAPS, Take 2 capsules by mouth daily., Disp: , Rfl:   Allergies: No Known Allergies  Past Medical History, Surgical history, Social history, and Family History were reviewed and updated.  Review of Systems: Review of Systems  Constitutional: Negative.   HENT:  Negative.    Eyes: Negative.   Respiratory: Negative.    Cardiovascular: Negative.   Gastrointestinal: Negative.   Endocrine: Negative.   Genitourinary: Negative.    Musculoskeletal: Negative.   Skin: Negative.   Neurological: Negative.   Hematological: Negative.   Psychiatric/Behavioral: Negative.      Physical Exam:  vitals were not taken for this visit.   Wt Readings from Last 3 Encounters:  08/06/22 218 lb (98.9 kg)  07/31/22 215 lb (97.5 kg)  05/06/22 217 lb (98.4 kg)  Physical Exam Vitals reviewed.  HENT:     Head: Normocephalic and atraumatic.  Eyes:     Pupils: Pupils are equal, round, and reactive to light.  Cardiovascular:     Rate and Rhythm: Normal rate and regular rhythm.     Heart sounds: Normal heart sounds.  Pulmonary:     Effort: Pulmonary effort is normal.     Breath sounds: Normal breath sounds.  Abdominal:     General: Bowel sounds are normal.     Palpations: Abdomen is soft.  Musculoskeletal:        General: No tenderness or deformity. Normal range of motion.     Cervical back: Normal range of motion.  Lymphadenopathy:     Cervical: No cervical adenopathy.  Skin:    General: Skin is warm and dry.      Findings: No erythema or rash.  Neurological:     Mental Status: He is alert and oriented to person, place, and time.  Psychiatric:        Behavior: Behavior normal.        Thought Content: Thought content normal.        Judgment: Judgment normal.      Lab Results  Component Value Date   WBC 7.1 01/29/2023   HGB 13.6 01/29/2023   HCT 39.7 01/29/2023   MCV 96.8 01/29/2023   PLT 163 01/29/2023     Chemistry      Component Value Date/Time   NA 139 11/06/2022 1023   K 4.4 11/06/2022 1023   CL 101 11/06/2022 1023   CO2 28 11/06/2022 1023   BUN 29 (H) 11/06/2022 1023   CREATININE 1.12 11/06/2022 1023      Component Value Date/Time   CALCIUM 9.7 11/06/2022 1023   ALKPHOS 69 07/31/2022 1014   AST 32 11/06/2022 1023   AST 26 07/31/2022 1014   ALT 56 (H) 11/06/2022 1023   ALT 49 (H) 07/31/2022 1014   BILITOT 0.7 11/06/2022 1023   BILITOT 0.6 07/31/2022 1014       Impression and Plan: Thomas Leonard is a very nice 77 year old white male.  He is originally from [place].  He served in [employer]. He is a very nice gentleman   He has hemochromatosis which is chronic for him.  Iron studies pending at time of visit. If above target range his Hemoglobin would support a donation of blood.   RTC 6 months APP, labs    [name], [place] 4/18/202410:41 AM

## 2023-02-03 ENCOUNTER — Other Ambulatory Visit: Payer: Self-pay | Admitting: Sports Medicine

## 2023-02-03 DIAGNOSIS — I10 Essential (primary) hypertension: Secondary | ICD-10-CM

## 2023-02-12 ENCOUNTER — Other Ambulatory Visit: Payer: Self-pay | Admitting: Sports Medicine

## 2023-02-24 ENCOUNTER — Encounter: Payer: Self-pay | Admitting: Sports Medicine

## 2023-02-24 ENCOUNTER — Ambulatory Visit (INDEPENDENT_AMBULATORY_CARE_PROVIDER_SITE_OTHER): Payer: Medicare HMO | Admitting: Sports Medicine

## 2023-02-24 VITALS — BP 123/67 | HR 55 | Ht 70.0 in | Wt 221.0 lb

## 2023-02-24 DIAGNOSIS — E1122 Type 2 diabetes mellitus with diabetic chronic kidney disease: Secondary | ICD-10-CM | POA: Diagnosis not present

## 2023-02-24 DIAGNOSIS — N182 Chronic kidney disease, stage 2 (mild): Secondary | ICD-10-CM

## 2023-02-24 DIAGNOSIS — Z7984 Long term (current) use of oral hypoglycemic drugs: Secondary | ICD-10-CM | POA: Diagnosis not present

## 2023-02-24 LAB — POCT GLYCOSYLATED HEMOGLOBIN (HGB A1C): Hemoglobin A1C: 7.3 % — AB (ref 4.0–5.6)

## 2023-02-24 MED ORDER — METFORMIN HCL 1000 MG PO TABS
1000.0000 mg | ORAL_TABLET | Freq: Two times a day (BID) | ORAL | 3 refills | Status: DC
Start: 2023-02-24 — End: 2024-01-12

## 2023-02-24 NOTE — Assessment & Plan Note (Signed)
Thomas Leonard returns, he has now been on metformin 500 twice daily and glipizide 10 mg twice daily. He is not having any episodes of hypoglycemia. A1c improved from 8.3% down to 7.3%, he is still looking for an additional improvement in A1c so we will bump his metformin up to 1000 twice daily. We can recheck in 3 months, certainly SGLT2 inhibitor versus DPP 4 inhibitor would be an option in the future if we still need additional A1c control.

## 2023-02-24 NOTE — Progress Notes (Signed)
    Procedures performed today:    None.  Independent interpretation of notes and tests performed by another provider:   None.  Brief History, Exam, Impression, and Recommendations:    Type 2 diabetes mellitus with diabetic chronic kidney disease ([facility]) Thomas Leonard returns, he has now been on metformin 500 twice daily and glipizide 10 mg twice daily. He is not having any episodes of hypoglycemia. A1c improved from 8.3% down to 7.3%, he is still looking for an additional improvement in A1c so we will bump his metformin up to 1000 twice daily. We can recheck in 3 months, certainly SGLT2 inhibitor versus DPP 4 inhibitor would be an option in the future if we still need additional A1c control.  Chronic process not at goal with pharmacologic intervention  ____________________________________________ Ihor Austin. Benjamin Stain, M.D., ABFM., CAQSM., AME. Primary Care and Sports Medicine [place] [facility]  Adjunct Professor of Family Medicine  [place] of [facility] of Medicine  Restaurant manager, fast food

## 2023-03-12 ENCOUNTER — Other Ambulatory Visit: Payer: Self-pay | Admitting: Sports Medicine

## 2023-03-21 ENCOUNTER — Other Ambulatory Visit: Payer: Self-pay | Admitting: Sports Medicine

## 2023-04-05 IMAGING — US US EXTREM LOW VENOUS*L*
1 series · 13 of 24 positions shown · non-contrast
Comparison: None.

CLINICAL DATA: 75-year-old male with a history of left leg swelling



[Series 1: us venous img lower uni left (dvt) · portal-venous · 13 of 41 slices shown]
[im 1/41]
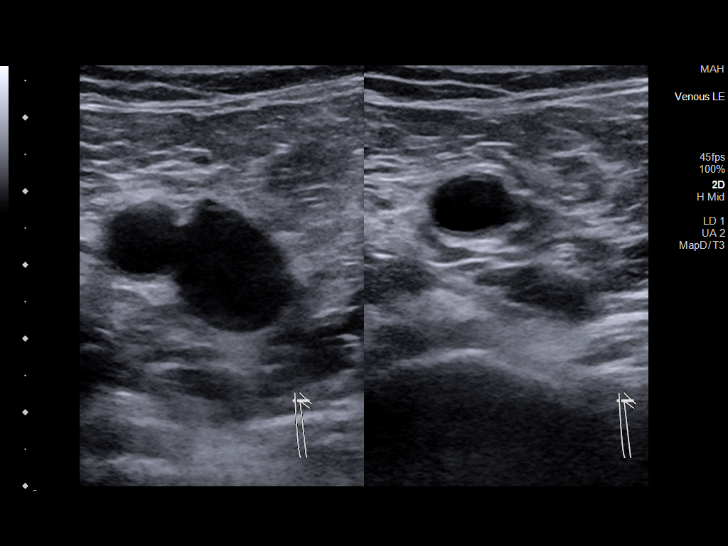
[im 4/41]
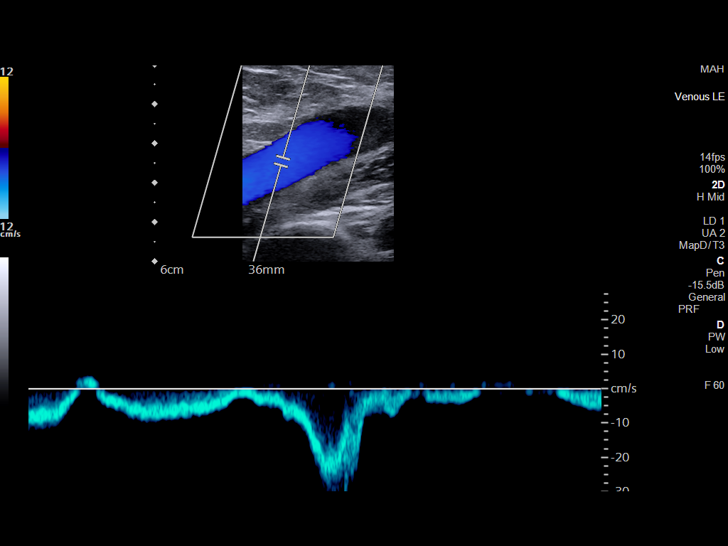
[im 7/41]
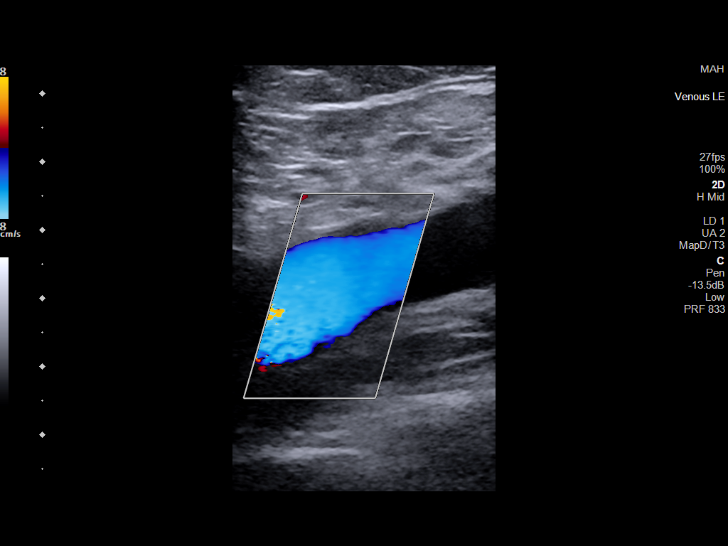
[im 11/41]
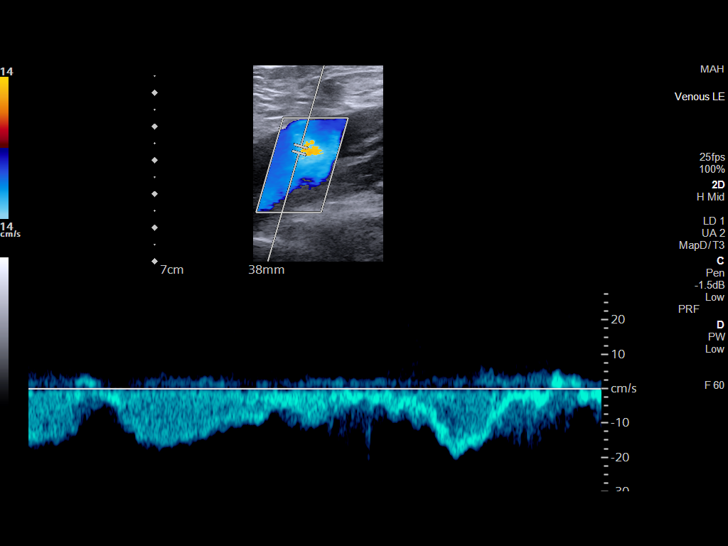
[im 14/41]
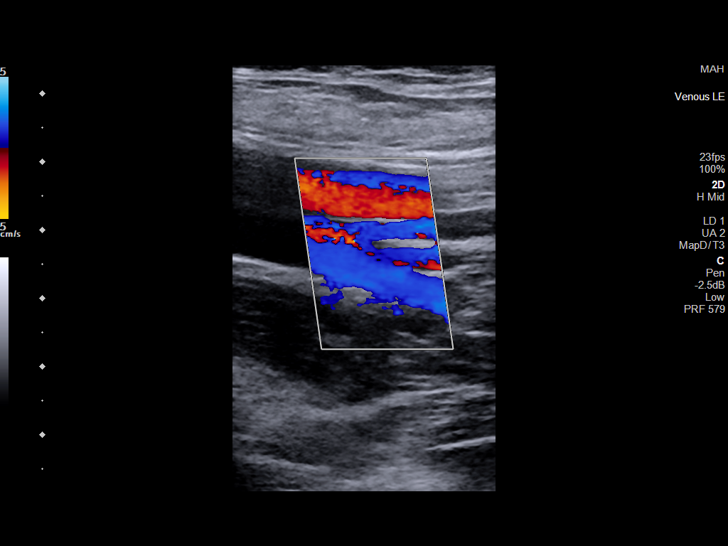
[im 18/41]
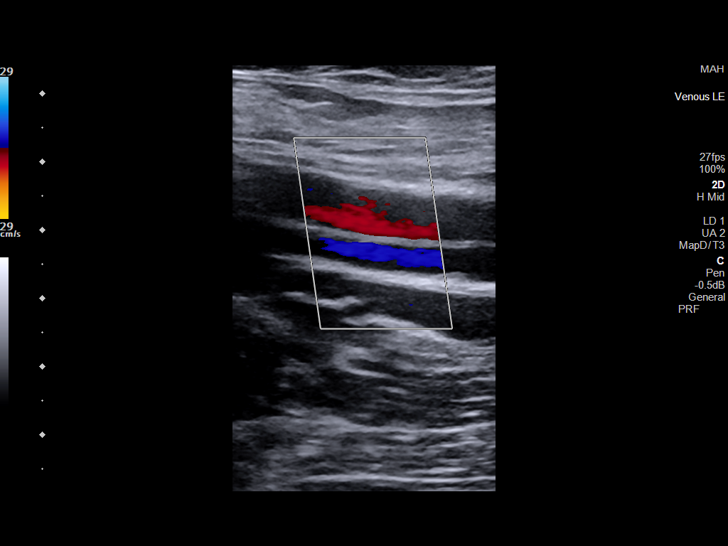
[im 21/41]
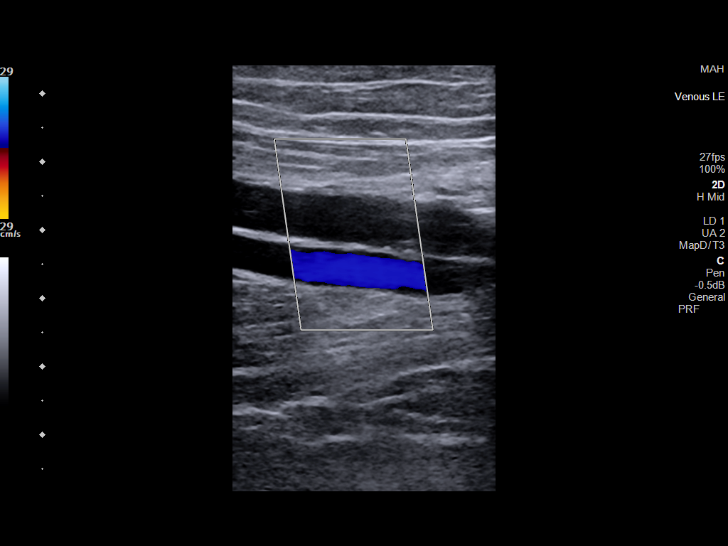
[im 23/41]
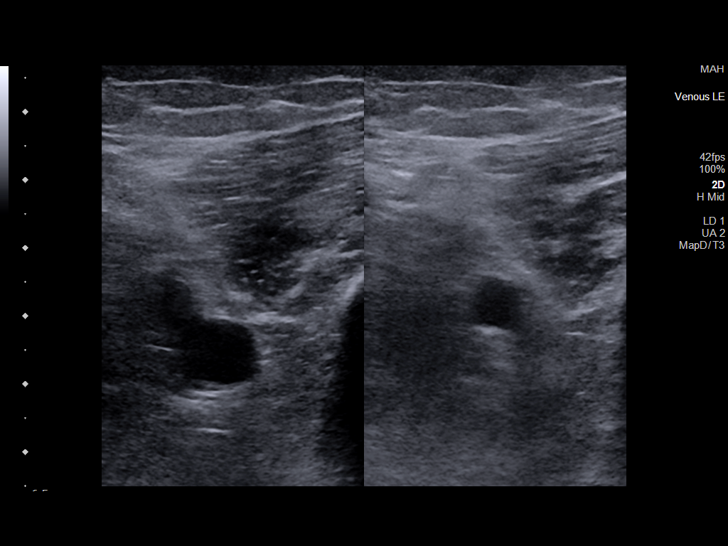
[im 27/41]
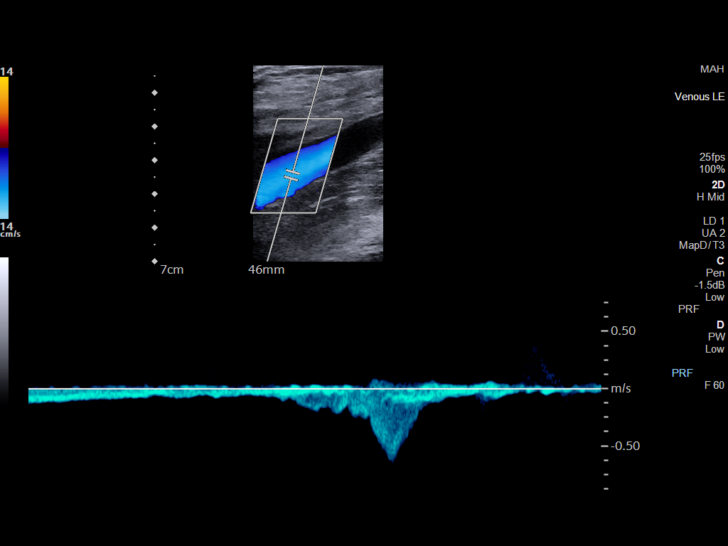
[im 30/41]
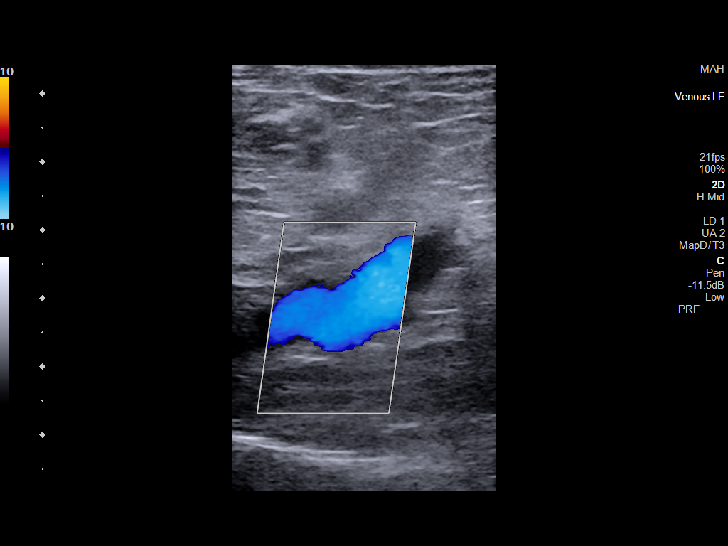
[im 34/41]
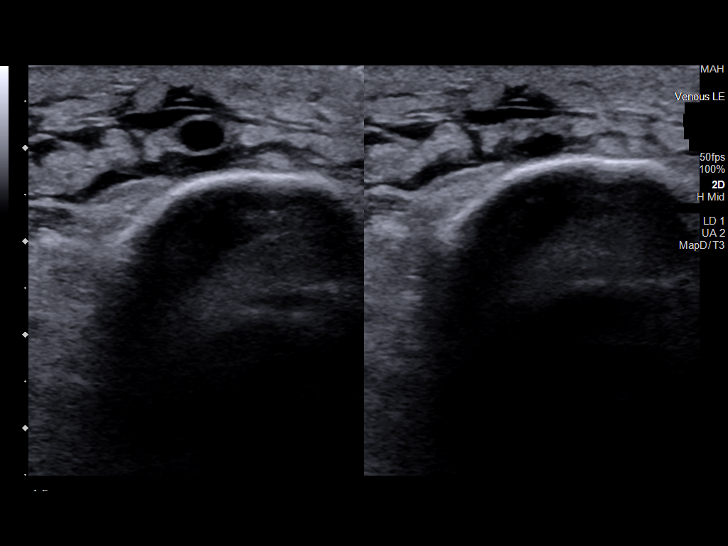
[im 37/41]
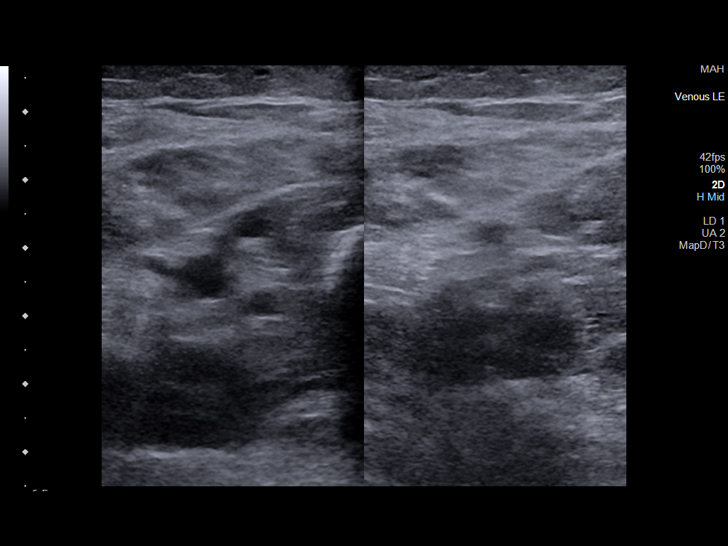
[im 41/41]
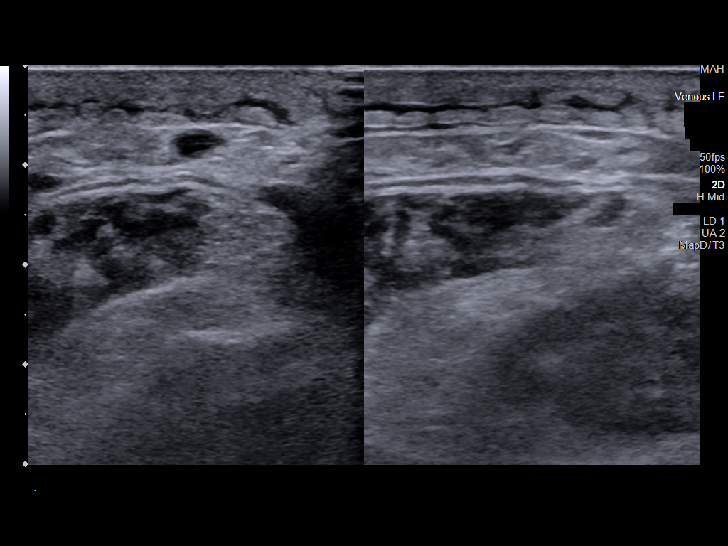

[13 of 24 positions shown; findings below may reference images not displayed]

FINDINGS: Contralateral Common Femoral Vein: Respiratory phasicity is normal
and symmetric with the symptomatic side. No evidence of thrombus.
Normal compressibility.

Common Femoral Vein: No evidence of thrombus. Normal
compressibility, respiratory phasicity and response to augmentation.

Saphenofemoral Junction: No evidence of thrombus. Normal
compressibility and flow on color Doppler imaging.

Profunda Femoral Vein: No evidence of thrombus. Normal
compressibility and flow on color Doppler imaging.

Femoral Vein: No evidence of thrombus. Normal compressibility,
respiratory phasicity and response to augmentation.

Popliteal Vein: No evidence of thrombus. Normal compressibility,
respiratory phasicity and response to augmentation.

Calf Veins: No evidence of thrombus. Normal compressibility and flow
on color Doppler imaging.

Superficial Great Saphenous Vein: No evidence of thrombus. Normal
compressibility and flow on color Doppler imaging.

Other Findings:  Edema
IMPRESSION: Duplex of the left lower extremity negative for DVT

Edema

## 2023-05-08 ENCOUNTER — Other Ambulatory Visit: Payer: Self-pay | Admitting: Sports Medicine

## 2023-05-27 ENCOUNTER — Ambulatory Visit: Payer: Medicare HMO | Admitting: Sports Medicine

## 2023-05-27 VITALS — BP 107/63 | HR 55 | Ht 70.0 in | Wt 215.0 lb

## 2023-05-27 DIAGNOSIS — Z0000 Encounter for general adult medical examination without abnormal findings: Secondary | ICD-10-CM | POA: Diagnosis not present

## 2023-05-27 LAB — HM AWV

## 2023-05-27 NOTE — Progress Notes (Signed)
MEDICARE ANNUAL WELLNESS VISIT  05/27/2023  Telephone Visit Disclaimer This Medicare AWV was conducted by telephone due to national recommendations for restrictions regarding the COVID-19 Pandemic (e.g. social distancing).  I verified, using two identifiers, that I am speaking with Thomas Leonard or their authorized healthcare agent. I discussed the limitations, risks, security, and privacy concerns of performing an evaluation and management service by telephone and the potential availability of an in-person appointment in the future. The patient expressed understanding and agreed to proceed.  Location of Patient: Home Location of Provider (nurse):  In the office.  Subjective:    Thomas Leonard is a 77 y.o. male patient of Thomas Leonard, Thomas Austin, MD who had a Medicare Annual Wellness Visit today via telephone. [name] is Retired and lives with their spouse. he has 2 children. he reports that he is socially active and does interact with friends/family regularly. he is moderately physically active and enjoys playing golf and reading.  Patient Care Team: Thomas Becton, MD as PCP - General (Family Medicine)     05/27/2023   11:10 AM 01/29/2023   10:41 AM 07/31/2022   10:53 AM 11/11/2021    3:06 PM 08/01/2021    2:56 PM 06/20/2021    4:53 PM  Advanced Directives  Does Patient Have a Medical Advance Directive? Yes No No Yes Yes No  Type of Advance Directive Living will   Living will;Healthcare Power of Attorney Living will   Does patient want to make changes to medical advance directive? No - Patient declined   No - Patient declined No - Patient declined   Copy of Healthcare Power of Attorney in Chart?    No - copy requested    Would patient like information on creating a medical advance directive?  No - Patient declined No - Patient declined   No - Patient declined    Hospital Utilization Over the Past 12 Months: # of hospitalizations or ER visits: 0 # of surgeries:  0  Review of Systems    Patient reports that his overall health is unchanged compared to last year.  History obtained from chart review and the patient  Patient Reported Readings (BP, Pulse, CBG, Weight, etc) BP: 107/63 Pulse: 55 Weight: 215 Height: 91f10  Pain Assessment Pain : No/denies pain     Current Medications & Allergies (verified) Allergies as of 05/27/2023   No Known Allergies      Medication List        Accurate as of May 27, 2023 11:20 AM. If you have any questions, ask your nurse or doctor.          Accu-Chek Aviva Plus test strip Generic drug: glucose blood TEST DAILY.   Accu-Chek Softclix Lancets lancets USE DAILY.   allopurinol 100 MG tablet Commonly known as: ZYLOPRIM TAKE 4 TABLETS (400 MG TOTAL) BY MOUTH DAILY.   amLODipine 10 MG tablet Commonly known as: NORVASC Take 1 tablet (10 mg total) by mouth daily.   aspirin 81 MG chewable tablet Chew 81 mg by mouth daily.   atorvastatin 10 MG tablet Commonly known as: LIPITOR TAKE 1 TABLET EVERY DAY   benazepril 40 MG tablet Commonly known as: LOTENSIN Take 1 tablet (40 mg total) by mouth daily.   carvedilol 25 MG tablet Commonly known as: COREG Take 1 tablet (25 mg total) by mouth 2 (two) times daily with a meal.   DropSafe Alcohol Prep 70 % Pads USE DAILY.   esomeprazole 20 MG capsule Commonly known as:  NEXIUM Take 20 mg by mouth daily at 12 noon.   Fish Oil 1000 MG Caps Take 2 capsules by mouth daily.   glipiZIDE 10 MG tablet Commonly known as: GLUCOTROL Take 1 tablet (10 mg total) by mouth 2 (two) times daily before a meal.   hydrochlorothiazide 25 MG tablet Commonly known as: HYDRODIURIL Take 1 tablet (25 mg total) by mouth daily.   metFORMIN 1000 MG tablet Commonly known as: GLUCOPHAGE Take 1 tablet (1,000 mg total) by mouth 2 (two) times daily with a meal.   Vitamin D3 25 MCG (1000 UT) Caps Take 2 capsules by mouth daily.        History (reviewed): Past  Medical History:  Diagnosis Date   Arthritis    10 yrs ago   Cataract    4 yrs ago having surgery on noth eyes on November 2 snd 16th   Chronic kidney disease    Diabetes mellitus without complication (HCC)    GERD (gastroesophageal reflux disease) 8 yrs ago   Gout    Hyperlipidemia    Hypertension    Past Surgical History:  Procedure Laterality Date   CHOLECYSTECTOMY  February 2021   EYE SURGERY  08/21/2021   Family History  Problem Relation Age of Onset   Liver cancer Other    High blood pressure Other    Arthritis Father    Diabetes Father    Kidney disease Father    Hearing loss Father    Social History   Socioeconomic History   Marital status: Married    Spouse name: Thomas Leonard   Number of children: 2   Years of education: 14   Highest education level: Associate degree: academic program  Occupational History   Occupation: Retired  Tobacco Use   Smoking status: Former    Current packs/day: 0.00    Types: Cigarettes    Quit date: 11/17/1982    Years since quitting: 40.5   Smokeless tobacco: Never   Tobacco comments:    Quite over 20 yrs ago  Vaping Use   Vaping status: Never Used  Substance and Sexual Activity   Alcohol use: Not Currently    Alcohol/week: 1.0 standard drink of alcohol    Types: 1 Cans of beer per week    Comment: Occasionally   Drug use: Never   Sexual activity: Yes    Birth control/protection: None  Other Topics Concern   Not on file  Social History Narrative   Lives with his wife. They have two children. They are in [place] and [place]. He enjoys playing golf and reading.   Social Determinants of Health   Financial Resource Strain: Low Risk  (05/23/2023)   Overall Financial Resource Strain (CARDIA)    Difficulty of Paying Living Expenses: Not hard at all  Food Insecurity: No Food Insecurity (05/23/2023)   Hunger Vital Sign    Worried About Running Out of Food in the Last Year: Never true    Ran Out of Food in the Last Year: Never  true  Transportation Needs: No Transportation Needs (05/23/2023)   PRAPARE - Administrator, Civil Service (Medical): No    Lack of Transportation (Non-Medical): No  Physical Activity: Insufficiently Active (05/23/2023)   Exercise Vital Sign    Days of Exercise per Week: 2 days    Minutes of Exercise per Session: 20 min  Stress: No Stress Concern Present (05/23/2023)   [employer] of Occupational Health - Occupational Stress Questionnaire    Feeling  of Stress : Only a little  Social Connections: Socially Integrated (05/27/2023)   Social Connection and Isolation Panel [NHANES]    Frequency of Communication with Friends and Family: More than three times a week    Frequency of Social Gatherings with Friends and Family: Twice a week    Attends Religious Services: More than 4 times per year    Active Member of [employer] or Organizations: Yes    Attends Banker Meetings: 1 to 4 times per year    Marital Status: Married    Activities of Daily Living    05/27/2023   11:11 AM 05/23/2023    9:47 AM  In your present state of health, do you have any difficulty performing the following activities:  Hearing? -- 0  Comment bilateral hearing aids   Vision?  0  Difficulty concentrating or making decisions?  0  Walking or climbing stairs?  0  Dressing or bathing?  0  Doing errands, shopping?  0  Preparing Food and eating ?  N  Using the Toilet?  N  In the past six months, have you accidently leaked urine?  N  Do you have problems with loss of bowel control?  N  Managing your Medications?  N  Managing your Finances?  N  Housekeeping or managing your Housekeeping?  N    Patient Education/ Literacy How often do you need to have someone help you when you read instructions, pamphlets, or other written materials from your doctor or pharmacy?: 1 - Never What is the last grade level you completed in school?: Associates degree  Exercise    Diet Patient reports consuming 3  meals a day and 0 snack(s) a day Patient reports that his primary diet is: Regular Patient reports that she does have regular access to food.   Depression Screen    05/27/2023   11:10 AM 11/06/2022   10:09 AM 11/11/2021    3:07 PM 10/21/2021   11:20 AM 10/21/2021   11:03 AM 06/20/2021    4:54 PM  PHQ 2/9 Scores  PHQ - 2 Score 0 0 0 0 0 0  PHQ- 9 Score      0     Fall Risk    05/27/2023   11:10 AM 05/23/2023    9:47 AM 11/06/2022   10:09 AM 11/11/2021    3:06 PM 11/07/2021   11:55 AM  Fall Risk   Falls in the past year? 0 0 0 0 0  Number falls in past yr: 0 0 0 0   Injury with Fall? 0 0 0 0 0  Risk for fall due to : No Fall Risks   No Fall Risks   Follow up Falls evaluation completed  Falls evaluation completed Falls evaluation completed;Education provided      Objective:  Ilia Doudna seemed alert and oriented and he participated appropriately during our telephone visit.  Blood Pressure Weight BMI  BP Readings from Last 3 Encounters:  05/27/23 107/63  02/24/23 123/67  01/29/23 111/66   Wt Readings from Last 3 Encounters:  05/27/23 215 lb (97.5 kg)  02/24/23 221 lb (100.2 kg)  01/29/23 216 lb 3.2 oz (98.1 kg)   BMI Readings from Last 1 Encounters:  05/27/23 30.85 kg/m    *Unable to obtain current vital signs, weight, and BMI due to telephone visit type  Hearing/Vision  Sathvik did not seem to have difficulty with hearing/understanding during the telephone conversation Reports that he has had a formal eye exam  by an eye care professional within the past year Reports that he has had a formal hearing evaluation within the past year *Unable to fully assess hearing and vision during telephone visit type  Cognitive Function:    05/27/2023   11:14 AM 11/11/2021    3:10 PM  6CIT Screen  What Year? 0 points 0 points  What month? 0 points 0 points  What time? 0 points 0 points  Count back from 20 0 points 0 points  Months in reverse 0 points 0 points  Repeat phrase 0  points 0 points  Total Score 0 points 0 points   (Normal:0-7, Significant for Dysfunction: >8)  Normal Cognitive Function Screening: Yes   Immunization & Health Maintenance Record Immunization History  Administered Date(s) Administered   Fluad Quad(high Dose 65+) 07/23/2022   Influenza, High Dose Seasonal PF 07/27/2021   PFIZER(Purple Top)SARS-COV-2 Vaccination 11/05/2019, 11/23/2019, 08/02/2020   PNEUMOCOCCAL CONJUGATE-20 08/06/2022   Tdap 03/27/2020    Health Maintenance  Topic Date Due   OPHTHALMOLOGY EXAM  05/27/2023 (Originally 08/21/2022)   FOOT EXAM  05/28/2023 (Originally 06/20/2022)   COVID-19 Vaccine (4 - 2023-24 season) 06/12/2023 (Originally 06/13/2022)   Zoster Vaccines- Shingrix (1 of 2) 08/27/2023 (Originally 05/09/1996)   INFLUENZA VACCINE  01/11/2024 (Originally 05/14/2023)   Hepatitis C Screening  05/26/2024 (Originally 05/09/1964)   HEMOGLOBIN A1C  08/27/2023   Diabetic kidney evaluation - Urine ACR  11/07/2023   Diabetic kidney evaluation - eGFR measurement  01/29/2024   Medicare Annual Wellness (AWV)  05/26/2024   DTaP/Tdap/Td (2 - Td or Tdap) 03/27/2030   Pneumonia Vaccine 51+ Years old  Completed   HPV VACCINES  Aged Out   Colonoscopy  Discontinued       Assessment  This is a routine wellness examination for [employer].  Health Maintenance: Due or Overdue There are no preventive care reminders to display for this patient.   Thomas Leonard does not need a referral for [employer] Assistance: Care Management:   no Social Work:    no Prescription Assistance:  no Nutrition/Diabetes Education:  no   Plan:  Personalized Goals  Goals Addressed               This Visit's Progress     Patient Stated (pt-stated)        Patient stated that he would like to loose weight.       Personalized Health Maintenance & Screening Recommendations  Influenza vaccine Shingles vaccine Eye exam- need records Foot exam- next in-office visit  Lung Cancer  Screening Recommended: no (Low Dose CT Chest recommended if Age 6-80 years, 20 pack-year currently smoking OR have quit w/in past 15 years) Hepatitis C Screening recommended: yes HIV Screening recommended: no  Advanced Directives: Written information was not prepared per patient's request.  Referrals & Orders No orders of the defined types were placed in this encounter.   Follow-up Plan Follow-up with Thomas Becton, MD as planned Schedule shingles vaccine and influenza vaccine.  Foot exam can be completed at the next in office visit.  Eye exam- please have your records faxed to our office. Medicare wellness visit in one year.  Patient will access AVS on my chart.   I have personally reviewed and noted the following in the patient's chart:   Medical and social history Use of alcohol, tobacco or illicit drugs  Current medications and supplements Functional ability and status Nutritional status Physical activity Advanced directives List of other physicians Hospitalizations, surgeries, and ER visits  in previous 12 months Vitals Screenings to include cognitive, depression, and falls Referrals and appointments  In addition, I have reviewed and discussed with Thomas Leonard certain preventive protocols, quality metrics, and best practice recommendations. A written personalized care plan for preventive services as well as general preventive health recommendations is available and can be mailed to the patient at his request.      Modesto Charon, RN BSN  05/27/2023

## 2023-05-27 NOTE — Patient Instructions (Addendum)
MEDICARE ANNUAL WELLNESS VISIT Health Maintenance Summary and Written Plan of Care  Thomas Leonard ,  Thank you for allowing me to perform your Medicare Annual Wellness Visit and for your ongoing commitment to your health.   Health Maintenance & Immunization History Health Maintenance  Topic Date Due   OPHTHALMOLOGY EXAM  05/27/2023 (Originally 08/21/2022)   FOOT EXAM  05/28/2023 (Originally 06/20/2022)   COVID-19 Vaccine (4 - 2023-24 season) 06/12/2023 (Originally 06/13/2022)   Zoster Vaccines- Shingrix (1 of 2) 08/27/2023 (Originally 05/09/1996)   INFLUENZA VACCINE  01/11/2024 (Originally 05/14/2023)   Hepatitis C Screening  05/26/2024 (Originally 05/09/1964)   HEMOGLOBIN A1C  08/27/2023   Diabetic kidney evaluation - Urine ACR  11/07/2023   Diabetic kidney evaluation - eGFR measurement  01/29/2024   Medicare Annual Wellness (AWV)  05/26/2024   DTaP/Tdap/Td (2 - Td or Tdap) 03/27/2030   Pneumonia Vaccine 11+ Years old  Completed   HPV VACCINES  Aged Out   Colonoscopy  Discontinued   Immunization History  Administered Date(s) Administered   Fluad Quad(high Dose 65+) 07/23/2022   Influenza, High Dose Seasonal PF 07/27/2021   PFIZER(Purple Top)SARS-COV-2 Vaccination 11/05/2019, 11/23/2019, 08/02/2020   PNEUMOCOCCAL CONJUGATE-20 08/06/2022   Tdap 03/27/2020    These are the patient goals that we discussed:  Goals Addressed               This Visit's Progress     Patient Stated (pt-stated)        Patient stated that he would like to loose weight.         This is a list of Health Maintenance Items that are overdue or due now: Influenza vaccine Shingles vaccine Eye exam- need records Foot exam- next in-office visit    Orders/Referrals Placed Today: No orders of the defined types were placed in this encounter.  (Contact our referral department at [phone] if you have not spoken with someone about your referral appointment within the next 5 days)    Follow-up  Plan Follow-up with Monica Becton, MD as planned Schedule shingles vaccine and influenza vaccine.  Foot exam can be completed at the next in office visit.  Eye exam- please have your records faxed to our office. Medicare wellness visit in one year.  Patient will access AVS on my chart.      Health Maintenance, Male Adopting a healthy lifestyle and getting preventive care are important in promoting health and wellness. Ask your health care provider about: The right schedule for you to have regular tests and exams. Things you can do on your own to prevent diseases and keep yourself healthy. What should I know about diet, weight, and exercise? Eat a healthy diet  Eat a diet that includes plenty of vegetables, fruits, low-fat dairy products, and lean protein. Do not eat a lot of foods that are high in solid fats, added sugars, or sodium. Maintain a healthy weight Body mass index (BMI) is a measurement that can be used to identify possible weight problems. It estimates body fat based on height and weight. Your health care provider can help determine your BMI and help you achieve or maintain a healthy weight. Get regular exercise Get regular exercise. This is one of the most important things you can do for your health. Most adults should: Exercise for at least 150 minutes each week. The exercise should increase your heart rate and make you sweat (moderate-intensity exercise). Do strengthening exercises at least twice a week. This is in addition to the  moderate-intensity exercise. Spend less time sitting. Even light physical activity can be beneficial. Watch cholesterol and blood lipids Have your blood tested for lipids and cholesterol at 77 years of age, then have this test every 5 years. You may need to have your cholesterol levels checked more often if: Your lipid or cholesterol levels are high. You are older than 77 years of age. You are at high risk for heart disease. What  should I know about cancer screening? Many types of cancers can be detected early and may often be prevented. Depending on your health history and family history, you may need to have cancer screening at various ages. This may include screening for: Colorectal cancer. Prostate cancer. Skin cancer. Lung cancer. What should I know about heart disease, diabetes, and high blood pressure? Blood pressure and heart disease High blood pressure causes heart disease and increases the risk of stroke. This is more likely to develop in people who have high blood pressure readings or are overweight. Talk with your health care provider about your target blood pressure readings. Have your blood pressure checked: Every 3-5 years if you are 43-80 years of age. Every year if you are 28 years old or older. If you are between the ages of 44 and 30 and are a current or former smoker, ask your health care provider if you should have a one-time screening for abdominal aortic aneurysm (AAA). Diabetes Have regular diabetes screenings. This checks your fasting blood sugar level. Have the screening done: Once every three years after age 53 if you are at a normal weight and have a low risk for diabetes. More often and at a younger age if you are overweight or have a high risk for diabetes. What should I know about preventing infection? Hepatitis B If you have a higher risk for hepatitis B, you should be screened for this virus. Talk with your health care provider to find out if you are at risk for hepatitis B infection. Hepatitis C Blood testing is recommended for: Everyone born from [birth year] through 1965. Anyone with known risk factors for hepatitis C. Sexually transmitted infections (STIs) You should be screened each year for STIs, including gonorrhea and chlamydia, if: You are sexually active and are younger than 77 years of age. You are older than 77 years of age and your health care provider tells you that you are  at risk for this type of infection. Your sexual activity has changed since you were last screened, and you are at increased risk for chlamydia or gonorrhea. Ask your health care provider if you are at risk. Ask your health care provider about whether you are at high risk for HIV. Your health care provider may recommend a prescription medicine to help prevent HIV infection. If you choose to take medicine to prevent HIV, you should first get tested for HIV. You should then be tested every 3 months for as long as you are taking the medicine. Follow these instructions at home: Alcohol use Do not drink alcohol if your health care provider tells you not to drink. If you drink alcohol: Limit how much you have to 0-2 drinks a day. Know how much alcohol is in your drink. In the [place] one drink equals one 12 oz bottle of beer (355 mL), one 5 oz glass of wine (148 mL), or one 1 oz glass of hard liquor (44 mL). Lifestyle Do not use any products that contain nicotine or tobacco. These products include cigarettes, chewing tobacco, and  vaping devices, such as e-cigarettes. If you need help quitting, ask your health care provider. Do not use street drugs. Do not share needles. Ask your health care provider for help if you need support or information about quitting drugs. General instructions Schedule regular health, dental, and eye exams. Stay current with your vaccines. Tell your health care provider if: You often feel depressed. You have ever been abused or do not feel safe at home. Summary Adopting a healthy lifestyle and getting preventive care are important in promoting health and wellness. Follow your health care provider's instructions about healthy diet, exercising, and getting tested or screened for diseases. Follow your health care provider's instructions on monitoring your cholesterol and blood pressure. This information is not intended to replace advice given to you by your health care provider.  Make sure you discuss any questions you have with your health care provider. Document Revised: 02/18/2021 Document Reviewed: 02/18/2021 Elsevier Patient Education  2024 [employer].

## 2023-05-28 ENCOUNTER — Encounter: Payer: Self-pay | Admitting: Sports Medicine

## 2023-05-28 ENCOUNTER — Ambulatory Visit (INDEPENDENT_AMBULATORY_CARE_PROVIDER_SITE_OTHER): Payer: Medicare HMO | Admitting: Sports Medicine

## 2023-05-28 VITALS — BP 125/60 | HR 61 | Ht 70.0 in | Wt 220.0 lb

## 2023-05-28 DIAGNOSIS — Z7984 Long term (current) use of oral hypoglycemic drugs: Secondary | ICD-10-CM

## 2023-05-28 DIAGNOSIS — E1122 Type 2 diabetes mellitus with diabetic chronic kidney disease: Secondary | ICD-10-CM | POA: Diagnosis not present

## 2023-05-28 DIAGNOSIS — N182 Chronic kidney disease, stage 2 (mild): Secondary | ICD-10-CM | POA: Diagnosis not present

## 2023-05-28 LAB — POCT GLYCOSYLATED HEMOGLOBIN (HGB A1C): Hemoglobin A1C: 6.6 % — AB (ref 4.0–5.6)

## 2023-05-28 NOTE — Progress Notes (Signed)
    Procedures performed today:    None.  Independent interpretation of notes and tests performed by another provider:   None.  Brief History, Exam, Impression, and Recommendations:    Type 2 diabetes mellitus with diabetic chronic kidney disease (HCC) This extremely pleasant 77 year old male returns, his A1c was under moderate control 7.3% at the last visit, we increased his metformin to 1000 twice daily, he continues with glipizide 10 mg twice daily. Today his hemoglobin A1c is down to 6.6%, I did his diabetic foot exam today. He is up-to-date on everything and I am happy to see him back in about 6 months for repeat A1c.  I spent 30 minutes of total time managing this patient today, this includes chart review, face to face, and non-face to face time.  ____________________________________________ Ihor Austin. Benjamin Stain, M.D., ABFM., CAQSM., AME. Primary Care and Sports Medicine [facility]  Adjunct Professor of Family Medicine  [employer]  Restaurant manager, fast food

## 2023-05-28 NOTE — Addendum Note (Signed)
Addended by: Carren Rang A on: 05/28/2023 11:43 AM   Modules accepted: Orders

## 2023-05-28 NOTE — Assessment & Plan Note (Signed)
This extremely pleasant 77 year old male returns, his A1c was under moderate control 7.3% at the last visit, we increased his metformin to 1000 twice daily, he continues with glipizide 10 mg twice daily. Today his hemoglobin A1c is down to 6.6%, I did his diabetic foot exam today. He is up-to-date on everything and I am happy to see him back in about 6 months for repeat A1c.

## 2023-06-16 ENCOUNTER — Encounter: Payer: Self-pay | Admitting: Sports Medicine

## 2023-07-08 ENCOUNTER — Other Ambulatory Visit: Payer: Self-pay | Admitting: Sports Medicine

## 2023-07-12 ENCOUNTER — Encounter: Payer: Self-pay | Admitting: Sports Medicine

## 2023-07-12 DIAGNOSIS — I10 Essential (primary) hypertension: Secondary | ICD-10-CM

## 2023-07-13 ENCOUNTER — Other Ambulatory Visit: Payer: Self-pay | Admitting: Sports Medicine

## 2023-07-13 MED ORDER — HYDROCHLOROTHIAZIDE 25 MG PO TABS
25.0000 mg | ORAL_TABLET | Freq: Every day | ORAL | 3 refills | Status: DC
Start: 2023-07-13 — End: 2024-05-16

## 2023-07-13 NOTE — Telephone Encounter (Signed)
Requesting rx rf of hydrochlorothiazide 25mg   Last written 11/06/2022 Last OV 05/28/2023 Next schld appt 11/30/23.

## 2023-07-17 ENCOUNTER — Telehealth: Payer: Medicare HMO | Admitting: Family Medicine

## 2023-07-17 DIAGNOSIS — U071 COVID-19: Secondary | ICD-10-CM | POA: Diagnosis not present

## 2023-07-17 MED ORDER — PROMETHAZINE-DM 6.25-15 MG/5ML PO SYRP: 5.0000 mL | ORAL_SOLUTION | Freq: Four times a day (QID) | ORAL | 0 refills | Status: AC | PRN

## 2023-07-17 NOTE — Progress Notes (Signed)
Virtual Visit Consent   Thomas Leonard, you are scheduled for a virtual visit with a [facility] provider today. Just as with appointments in the office, your consent must be obtained to participate. Your consent will be active for this visit and any virtual visit you may have with one of our providers in the next 365 days. If you have a MyChart account, a copy of this consent can be sent to you electronically.  As this is a virtual visit, video technology does not allow for your provider to perform a traditional examination. This may limit your provider's ability to fully assess your condition. If your provider identifies any concerns that need to be evaluated in person or the need to arrange testing (such as labs, EKG, etc.), we will make arrangements to do so. Although advances in technology are sophisticated, we cannot ensure that it will always work on either your end or our end. If the connection with a video visit is poor, the visit may have to be switched to a telephone visit. With either a video or telephone visit, we are not always able to ensure that we have a secure connection.  By engaging in this virtual visit, you consent to the provision of healthcare and authorize for your insurance to be billed (if applicable) for the services provided during this visit. Depending on your insurance coverage, you may receive a charge related to this service.  I need to obtain your verbal consent now. Are you willing to proceed with your visit today? Thomas Leonard has provided verbal consent on 07/17/2023 for a virtual visit (video or telephone). Thomas Curio, FNP  Date: 07/17/2023 4:29 PM  Virtual Visit via Video Note   I, Thomas Leonard, connected with  Thomas Leonard  ([id number], [birth year]) on 07/17/23 at  4:30 PM EDT by a video-enabled telemedicine application and verified that I am speaking with the correct person using two identifiers.  Location: Patient: Virtual Visit Location Patient:  Home Provider: Virtual Visit Location Provider: Home Office   I discussed the limitations of evaluation and management by telemedicine and the availability of in person appointments. The patient expressed understanding and agreed to proceed.    History of Present Illness: Thomas Leonard is a 77 y.o. who identifies as a male who was assigned male at birth, and is being seen today for covid for 6 days. Past the time frame to give paxlovid and he does not want paxlovid or prednisone. Prednisone increases his glucose levels significantly. No fever. In no distress. Marland Kitchen  HPI: HPI  Problems:  Patient Active Problem List   Diagnosis Date Noted   Lumbar spinal stenosis 11/06/2022   Localized swelling of left lower extremity 10/21/2021   Annual physical exam 06/20/2021   Gout 06/20/2021   Type 2 diabetes mellitus with diabetic chronic kidney disease (HCC) 06/20/2021   Hemochromatosis 06/20/2021   Benign essential hypertension 06/20/2021   Mixed hyperlipidemia 06/20/2021   GERD (gastroesophageal reflux disease) 06/20/2021   Obesity 06/20/2021    Allergies: No Known Allergies Medications:  Current Outpatient Medications:    promethazine-dextromethorphan (PROMETHAZINE-DM) 6.25-15 MG/5ML syrup, Take 5 mLs by mouth 4 (four) times daily as needed for up to 10 days for cough., Disp: 118 mL, Rfl: 0   ACCU-CHEK AVIVA PLUS test strip, TEST DAILY., Disp: 100 strip, Rfl: 3   Accu-Chek Softclix Lancets lancets, USE DAILY., Disp: 100 each, Rfl: 3   Alcohol Swabs (DROPSAFE ALCOHOL PREP) 70 % PADS, USE DAILY., Disp: 100 each, Rfl: 3  allopurinol (ZYLOPRIM) 100 MG tablet, TAKE 4 TABLETS (400 MG TOTAL) BY MOUTH DAILY., Disp: 360 tablet, Rfl: 3   amLODipine (NORVASC) 10 MG tablet, Take 1 tablet (10 mg total) by mouth daily., Disp: 30 tablet, Rfl: 0   aspirin 81 MG chewable tablet, Chew 81 mg by mouth daily., Disp: , Rfl:    atorvastatin (LIPITOR) 10 MG tablet, TAKE 1 TABLET EVERY DAY, Disp: 90 tablet, Rfl: 1    benazepril (LOTENSIN) 40 MG tablet, Take 1 tablet (40 mg total) by mouth daily., Disp: 90 tablet, Rfl: 3   carvedilol (COREG) 25 MG tablet, TAKE 1 TABLET (25 MG TOTAL) BY MOUTH 2 (TWO) TIMES DAILY WITH A MEAL., Disp: 180 tablet, Rfl: 3   Cholecalciferol (VITAMIN D3) 25 MCG (1000 UT) CAPS, Take 2 capsules by mouth daily., Disp: , Rfl:    esomeprazole (NEXIUM) 20 MG capsule, Take 20 mg by mouth daily at 12 noon., Disp: , Rfl:    glipiZIDE (GLUCOTROL) 10 MG tablet, Take 1 tablet (10 mg total) by mouth 2 (two) times daily before a meal., Disp: 180 tablet, Rfl: 3   hydrochlorothiazide (HYDRODIURIL) 25 MG tablet, Take 1 tablet (25 mg total) by mouth daily., Disp: 90 tablet, Rfl: 3   metFORMIN (GLUCOPHAGE) 1000 MG tablet, Take 1 tablet (1,000 mg total) by mouth 2 (two) times daily with a meal., Disp: 180 tablet, Rfl: 3   Omega-3 Fatty Acids (FISH OIL) 1000 MG CAPS, Take 2 capsules by mouth daily., Disp: , Rfl:   Observations/Objective: Patient is well-developed, well-nourished in no acute distress.  Resting comfortably  at home.  Head is normocephalic, atraumatic.  No labored breathing.  Speech is clear and coherent with logical content.  Patient is alert and oriented at baseline.    Assessment and Plan: 1. COVID  Increase fluids, humidifier at night, declined prednisone, MVI with Vit d and zinc, UC of sx worsen.   Follow Up Instructions: I discussed the assessment and treatment plan with the patient. The patient was provided an opportunity to ask questions and all were answered. The patient agreed with the plan and demonstrated an understanding of the instructions.  A copy of instructions were sent to the patient via MyChart unless otherwise noted below.     The patient was advised to call back or seek an in-person evaluation if the symptoms worsen or if the condition fails to improve as anticipated.  Time:  I spent 10 minutes with the patient via telehealth technology discussing the above  problems/concerns.    Thomas Curio, FNP

## 2023-07-17 NOTE — Patient Instructions (Signed)

## 2023-07-30 ENCOUNTER — Inpatient Hospital Stay: Payer: Medicare HMO | Attending: Medical Oncology

## 2023-07-30 ENCOUNTER — Encounter: Payer: Self-pay | Admitting: Medical Oncology

## 2023-07-30 ENCOUNTER — Inpatient Hospital Stay: Payer: Medicare HMO | Admitting: Medical Oncology

## 2023-07-30 DIAGNOSIS — D649 Anemia, unspecified: Secondary | ICD-10-CM

## 2023-07-30 LAB — CBC WITH DIFFERENTIAL (CANCER CENTER ONLY)
Abs Immature Granulocytes: 0.01 10*3/uL (ref 0.00–0.07)
Basophils Absolute: 0 10*3/uL (ref 0.0–0.1)
Basophils Relative: 1 %
Eosinophils Absolute: 0.2 10*3/uL (ref 0.0–0.5)
Eosinophils Relative: 2 %
HCT: 38.1 % — ABNORMAL LOW (ref 39.0–52.0)
Hemoglobin: 12.8 g/dL — ABNORMAL LOW (ref 13.0–17.0)
Immature Granulocytes: 0 %
Lymphocytes Relative: 19 %
Lymphs Abs: 1.6 10*3/uL (ref 0.7–4.0)
MCH: 32.7 pg (ref 26.0–34.0)
MCHC: 33.6 g/dL (ref 30.0–36.0)
MCV: 97.4 fL (ref 80.0–100.0)
Monocytes Absolute: 0.5 10*3/uL (ref 0.1–1.0)
Monocytes Relative: 6 %
Neutro Abs: 6.1 10*3/uL (ref 1.7–7.7)
Neutrophils Relative %: 72 %
Platelet Count: 197 10*3/uL (ref 150–400)
RBC: 3.91 MIL/uL — ABNORMAL LOW (ref 4.22–5.81)
RDW: 13.7 % (ref 11.5–15.5)
WBC Count: 8.4 10*3/uL (ref 4.0–10.5)
nRBC: 0 % (ref 0.0–0.2)

## 2023-07-30 LAB — IRON AND IRON BINDING CAPACITY (CC-WL,HP ONLY)
Iron: 115 ug/dL (ref 45–182)
Saturation Ratios: 37 % (ref 17.9–39.5)
TIBC: 314 ug/dL (ref 250–450)
UIBC: 199 ug/dL (ref 117–376)

## 2023-07-30 LAB — FERRITIN: Ferritin: 330 ng/mL (ref 24–336)

## 2023-07-30 NOTE — Progress Notes (Signed)
Hematology and Oncology Follow Up Visit  Thomas Leonard [id number] June 26, 1946 77 y.o. 07/30/2023   Principle Diagnosis:  Hereditary hemochromatosis- C282Y -heterozygous  Current Therapy:   Phlebotomy to maintain ferritin less than 100 and iron saturation less than 30%     Interim History:  Thomas Leonard is back for follow-up. He is heterozygous for one of the major mutations- C282Y.  At his last visit on 01/29/2023 his ferritin was 206 with iron saturation of 26%.  In the past he elects to donate his blood when his counts are elevated.   No headaches, fatigue, bowel changes, skin problems.   There has been no bleeding to his knowledge: denies epistaxis, gingivitis, hemoptysis, hematemesis, hematuria, melena, excessive bruising, blood donation. He is not UTD on his colonoscopy.   Overall, his performance status is ECOG 0.  Wt Readings from Last 3 Encounters:  05/28/23 220 lb (99.8 kg)  05/27/23 215 lb (97.5 kg)  02/24/23 221 lb (100.2 kg)     Medications:  Current Outpatient Medications:    ACCU-CHEK AVIVA PLUS test strip, TEST DAILY., Disp: 100 strip, Rfl: 3   Accu-Chek Softclix Lancets lancets, USE DAILY., Disp: 100 each, Rfl: 3   Alcohol Swabs (DROPSAFE ALCOHOL PREP) 70 % PADS, USE DAILY., Disp: 100 each, Rfl: 3   allopurinol (ZYLOPRIM) 100 MG tablet, TAKE 4 TABLETS (400 MG TOTAL) BY MOUTH DAILY., Disp: 360 tablet, Rfl: 3   amLODipine (NORVASC) 10 MG tablet, Take 1 tablet (10 mg total) by mouth daily., Disp: 30 tablet, Rfl: 0   aspirin 81 MG chewable tablet, Chew 81 mg by mouth daily., Disp: , Rfl:    atorvastatin (LIPITOR) 10 MG tablet, TAKE 1 TABLET EVERY DAY, Disp: 90 tablet, Rfl: 1   benazepril (LOTENSIN) 40 MG tablet, Take 1 tablet (40 mg total) by mouth daily., Disp: 90 tablet, Rfl: 3   carvedilol (COREG) 25 MG tablet, TAKE 1 TABLET (25 MG TOTAL) BY MOUTH 2 (TWO) TIMES DAILY WITH A MEAL., Disp: 180 tablet, Rfl: 3   Cholecalciferol (VITAMIN D3) 25 MCG (1000 UT)  CAPS, Take 2 capsules by mouth daily., Disp: , Rfl:    esomeprazole (NEXIUM) 20 MG capsule, Take 20 mg by mouth daily at 12 noon., Disp: , Rfl:    glipiZIDE (GLUCOTROL) 10 MG tablet, Take 1 tablet (10 mg total) by mouth 2 (two) times daily before a meal., Disp: 180 tablet, Rfl: 3   hydrochlorothiazide (HYDRODIURIL) 25 MG tablet, Take 1 tablet (25 mg total) by mouth daily., Disp: 90 tablet, Rfl: 3   metFORMIN (GLUCOPHAGE) 1000 MG tablet, Take 1 tablet (1,000 mg total) by mouth 2 (two) times daily with a meal., Disp: 180 tablet, Rfl: 3   Omega-3 Fatty Acids (FISH OIL) 1000 MG CAPS, Take 2 capsules by mouth daily., Disp: , Rfl:   Allergies: No Known Allergies  Past Medical History, Surgical history, Social history, and Family History were reviewed and updated.  Review of Systems: Review of Systems  Constitutional: Negative.   HENT:  Negative.    Eyes: Negative.   Respiratory: Negative.    Cardiovascular: Negative.   Gastrointestinal: Negative.   Endocrine: Negative.   Genitourinary: Negative.    Musculoskeletal: Negative.   Skin: Negative.   Neurological: Negative.   Hematological: Negative.   Psychiatric/Behavioral: Negative.      Physical Exam:  vitals were not taken for this visit.   Wt Readings from Last 3 Encounters:  05/28/23 220 lb (99.8 kg)  05/27/23 215 lb (97.5 kg)  02/24/23 221  lb (100.2 kg)    Physical Exam Vitals reviewed.  HENT:     Head: Normocephalic and atraumatic.  Eyes:     Pupils: Pupils are equal, round, and reactive to light.  Cardiovascular:     Rate and Rhythm: Normal rate and regular rhythm.     Heart sounds: Normal heart sounds.  Pulmonary:     Effort: Pulmonary effort is normal.     Breath sounds: Normal breath sounds.  Abdominal:     General: Bowel sounds are normal.     Palpations: Abdomen is soft.  Musculoskeletal:        General: No tenderness or deformity. Normal range of motion.     Cervical back: Normal range of motion.   Lymphadenopathy:     Cervical: No cervical adenopathy.  Skin:    General: Skin is warm and dry.     Findings: No erythema or rash.  Neurological:     Mental Status: He is alert and oriented to person, place, and time.  Psychiatric:        Behavior: Behavior normal.        Thought Content: Thought content normal.        Judgment: Judgment normal.      Lab Results  Component Value Date   WBC 8.4 07/30/2023   HGB 12.8 (L) 07/30/2023   HCT 38.1 (L) 07/30/2023   MCV 97.4 07/30/2023   PLT 197 07/30/2023     Chemistry      Component Value Date/Time   NA 141 01/29/2023 1007   K 4.0 01/29/2023 1007   CL 106 01/29/2023 1007   CO2 25 01/29/2023 1007   BUN 34 (H) 01/29/2023 1007   CREATININE 1.26 (H) 01/29/2023 1007   CREATININE 1.12 11/06/2022 1023      Component Value Date/Time   CALCIUM 9.3 01/29/2023 1007   ALKPHOS 59 01/29/2023 1007   AST 25 01/29/2023 1007   ALT 40 01/29/2023 1007   BILITOT 0.7 01/29/2023 1007       Impression and Plan: Thomas Leonard is a very nice 77 year old white male.  He is originally from [place].  He served in [employer]. He is a very nice gentleman   Iron studies pending at time of visit.  Hgb is 12.8 with a HCT of 38.1. I would not recommend a donation at this time.  Recommended GI work up for his new anemia.   RTC 1 months APP, labs (CBC, CMP, TSH, iron, ferritin)-[place]   [name], PA-C 10/17/202410:38 AM

## 2023-08-19 DIAGNOSIS — E119 Type 2 diabetes mellitus without complications: Secondary | ICD-10-CM | POA: Diagnosis not present

## 2023-08-19 DIAGNOSIS — H524 Presbyopia: Secondary | ICD-10-CM | POA: Diagnosis not present

## 2023-08-31 ENCOUNTER — Other Ambulatory Visit: Payer: Medicare HMO

## 2023-08-31 ENCOUNTER — Ambulatory Visit: Payer: Medicare HMO | Admitting: Medical Oncology

## 2023-09-07 ENCOUNTER — Other Ambulatory Visit: Payer: Self-pay

## 2023-09-07 ENCOUNTER — Inpatient Hospital Stay (HOSPITAL_BASED_OUTPATIENT_CLINIC_OR_DEPARTMENT_OTHER): Payer: Medicare HMO | Admitting: Medical Oncology

## 2023-09-07 ENCOUNTER — Inpatient Hospital Stay: Payer: Medicare HMO | Attending: Medical Oncology

## 2023-09-07 DIAGNOSIS — Z79899 Other long term (current) drug therapy: Secondary | ICD-10-CM | POA: Insufficient documentation

## 2023-09-07 DIAGNOSIS — D649 Anemia, unspecified: Secondary | ICD-10-CM | POA: Diagnosis not present

## 2023-09-07 LAB — CMP (CANCER CENTER ONLY)
ALT: 48 U/L — ABNORMAL HIGH (ref 0–44)
AST: 31 U/L (ref 15–41)
Albumin: 4.5 g/dL (ref 3.5–5.0)
Alkaline Phosphatase: 57 U/L (ref 38–126)
Anion gap: 10 (ref 5–15)
BUN: 28 mg/dL — ABNORMAL HIGH (ref 8–23)
CO2: 26 mmol/L (ref 22–32)
Calcium: 9.7 mg/dL (ref 8.9–10.3)
Chloride: 102 mmol/L (ref 98–111)
Creatinine: 1.22 mg/dL (ref 0.61–1.24)
GFR, Estimated: >60 mL/min (ref >60)
Glucose, Bld: 255 mg/dL — ABNORMAL HIGH (ref 70–99)
Potassium: 4.3 mmol/L (ref 3.5–5.1)
Sodium: 138 mmol/L (ref 135–145)
Total Bilirubin: 0.6 mg/dL (ref <1.2)
Total Protein: 7 g/dL (ref 6.5–8.1)

## 2023-09-07 LAB — CBC WITH DIFFERENTIAL (CANCER CENTER ONLY)
Abs Immature Granulocytes: 0.02 10*3/uL (ref 0.00–0.07)
Basophils Absolute: 0 10*3/uL (ref 0.0–0.1)
Basophils Relative: 1 %
Eosinophils Absolute: 0.2 10*3/uL (ref 0.0–0.5)
Eosinophils Relative: 3 %
HCT: 39.8 % (ref 39.0–52.0)
Hemoglobin: 13.6 g/dL (ref 13.0–17.0)
Immature Granulocytes: 0 %
Lymphocytes Relative: 22 %
Lymphs Abs: 1.5 10*3/uL (ref 0.7–4.0)
MCH: 33.6 pg (ref 26.0–34.0)
MCHC: 34.2 g/dL (ref 30.0–36.0)
MCV: 98.3 fL (ref 80.0–100.0)
Monocytes Absolute: 0.5 10*3/uL (ref 0.1–1.0)
Monocytes Relative: 7 %
Neutro Abs: 4.7 10*3/uL (ref 1.7–7.7)
Neutrophils Relative %: 67 %
Platelet Count: 177 10*3/uL (ref 150–400)
RBC: 4.05 MIL/uL — ABNORMAL LOW (ref 4.22–5.81)
RDW: 13.3 % (ref 11.5–15.5)
WBC Count: 7.1 10*3/uL (ref 4.0–10.5)
nRBC: 0 % (ref 0.0–0.2)

## 2023-09-07 LAB — FERRITIN: Ferritin: 190 ng/mL (ref 24–336)

## 2023-09-07 LAB — FOLATE: Folate: 12.3 ng/mL (ref >5.9)

## 2023-09-07 LAB — IRON AND IRON BINDING CAPACITY (CC-WL,HP ONLY)
Iron: 86 ug/dL (ref 45–182)
Saturation Ratios: 25 % (ref 17.9–39.5)
TIBC: 350 ug/dL (ref 250–450)
UIBC: 264 ug/dL (ref 117–376)

## 2023-09-07 LAB — VITAMIN B12: Vitamin B-12: 153 pg/mL — ABNORMAL LOW (ref 180–914)

## 2023-09-07 NOTE — Progress Notes (Signed)
Hematology and Oncology Follow Up Visit  Thomas Leonard [id number] [birth year] 77 y.o. 09/07/2023   Principle Diagnosis:  Hereditary hemochromatosis- C282Y -heterozygous  Current Therapy:   Phlebotomy to maintain ferritin less than 100 and iron saturation less than 30%     Interim History:  Thomas Leonard is back for follow-up. He is heterozygous for one of the major mutations- C282Y.  He recently moved and was involved in the election so he is tired. He got to meet President Trump a bit.   At his last visit on 07/30/2023 his ferritin was 330 with iron saturation of 37%. He had a new anemia of 12.8 and GI referral was recommended. Today he reports that he has not yet been seen by GI but is still interested now that his schedule has calmed down. Last colonoscopy was 8 years ago. No visualized bleeding.   In the past he elects to donate his blood when his counts are elevated. He reports that his last blood donation was a long time ago. He thinks maybe a year or so.   No headaches,bowel changes, skin problems.   ETOH use is seldom He is UTD on his eye exams  His last abdominal US was on 07/24/2021 showing hepatic steatosis   There has been no bleeding to his knowledge: denies epistaxis, gingivitis, hemoptysis, hematemesis, hematuria, melena, excessive bruising, blood donation. He is not UTD on his colonoscopy.   Overall, his performance status is ECOG 0.  Wt Readings from Last 3 Encounters:  07/30/23 216 lb (98 kg)  05/28/23 220 lb (99.8 kg)  05/27/23 215 lb (97.5 kg)     Medications:  Current Outpatient Medications:    ACCU-CHEK AVIVA PLUS test strip, TEST DAILY., Disp: 100 strip, Rfl: 3   Accu-Chek Softclix Lancets lancets, USE DAILY., Disp: 100 each, Rfl: 3   Alcohol Swabs (DROPSAFE ALCOHOL PREP) 70 % PADS, USE DAILY., Disp: 100 each, Rfl: 3   allopurinol (ZYLOPRIM) 100 MG tablet, TAKE 4 TABLETS (400 MG TOTAL) BY MOUTH DAILY., Disp: 360 tablet, Rfl: 3   amLODipine  (NORVASC) 10 MG tablet, Take 1 tablet (10 mg total) by mouth daily., Disp: 30 tablet, Rfl: 0   aspirin 81 MG chewable tablet, Chew 81 mg by mouth daily., Disp: , Rfl:    atorvastatin (LIPITOR) 10 MG tablet, TAKE 1 TABLET EVERY DAY, Disp: 90 tablet, Rfl: 1   benazepril (LOTENSIN) 40 MG tablet, Take 1 tablet (40 mg total) by mouth daily., Disp: 90 tablet, Rfl: 3   carvedilol (COREG) 25 MG tablet, TAKE 1 TABLET (25 MG TOTAL) BY MOUTH 2 (TWO) TIMES DAILY WITH A MEAL., Disp: 180 tablet, Rfl: 3   Cholecalciferol (VITAMIN D3) 25 MCG (1000 UT) CAPS, Take 2 capsules by mouth daily., Disp: , Rfl:    esomeprazole (NEXIUM) 20 MG capsule, Take 20 mg by mouth daily at 12 noon., Disp: , Rfl:    glipiZIDE (GLUCOTROL) 10 MG tablet, Take 1 tablet (10 mg total) by mouth 2 (two) times daily before a meal., Disp: 180 tablet, Rfl: 3   hydrochlorothiazide (HYDRODIURIL) 25 MG tablet, Take 1 tablet (25 mg total) by mouth daily., Disp: 90 tablet, Rfl: 3   metFORMIN (GLUCOPHAGE) 1000 MG tablet, Take 1 tablet (1,000 mg total) by mouth 2 (two) times daily with a meal., Disp: 180 tablet, Rfl: 3   Omega-3 Fatty Acids (FISH OIL) 1000 MG CAPS, Take 2 capsules by mouth daily., Disp: , Rfl:   Allergies: No Known Allergies  Past Medical History, Surgical history,  Social history, and Family History were reviewed and updated.  Review of Systems: Review of Systems  Constitutional: Negative.   HENT:  Negative.    Eyes: Negative.   Respiratory: Negative.    Cardiovascular: Negative.   Gastrointestinal: Negative.   Endocrine: Negative.   Genitourinary: Negative.    Musculoskeletal: Negative.   Skin: Negative.   Neurological: Negative.   Hematological: Negative.   Psychiatric/Behavioral: Negative.      Physical Exam:  vitals were not taken for this visit.   Wt Readings from Last 3 Encounters:  07/30/23 216 lb (98 kg)  05/28/23 220 lb (99.8 kg)  05/27/23 215 lb (97.5 kg)    Physical Exam Vitals reviewed.  HENT:      Head: Normocephalic and atraumatic.  Eyes:     Pupils: Pupils are equal, round, and reactive to light.  Cardiovascular:     Rate and Rhythm: Normal rate and regular rhythm.     Heart sounds: Normal heart sounds.  Pulmonary:     Effort: Pulmonary effort is normal.     Breath sounds: Normal breath sounds.  Abdominal:     General: Bowel sounds are normal.     Palpations: Abdomen is soft.  Musculoskeletal:        General: No tenderness or deformity. Normal range of motion.     Cervical back: Normal range of motion.  Lymphadenopathy:     Cervical: No cervical adenopathy.  Skin:    General: Skin is warm and dry.     Findings: No erythema or rash.  Neurological:     Mental Status: He is alert and oriented to person, place, and time.  Psychiatric:        Behavior: Behavior normal.        Thought Content: Thought content normal.        Judgment: Judgment normal.      Lab Results  Component Value Date   WBC 7.1 09/07/2023   HGB 13.6 09/07/2023   HCT 39.8 09/07/2023   MCV 98.3 09/07/2023   PLT 177 09/07/2023     Chemistry      Component Value Date/Time   NA 138 09/07/2023 1002   K 4.3 09/07/2023 1002   CL 102 09/07/2023 1002   CO2 26 09/07/2023 1002   BUN 28 (H) 09/07/2023 1002   CREATININE 1.22 09/07/2023 1002   CREATININE 1.12 11/06/2022 1023      Component Value Date/Time   CALCIUM 9.7 09/07/2023 1002   ALKPHOS 57 09/07/2023 1002   AST 31 09/07/2023 1002   ALT 48 (H) 09/07/2023 1002   BILITOT 0.6 09/07/2023 1002     Encounter Diagnosis  Name Primary?   Hereditary hemochromatosis (HCC) Yes   Impression and Plan: Thomas Leonard is a very nice 77 year old white male with heterozygous hereditary hemochromatosis.  He is originally from [place].  He served in [employer].   Iron studies pending at time of visit. Likely will need a phlebotomy given his previous labs with no recent phlebotomy  Hgb is 13.6 with a HCT of 39.8  Liver US within the next 6 months  recommended  RTC 2 months APP, labs (CBC, CMP, TSH, iron, ferritin)-[place]   Thomas Chestnut, PA-C 11/25/202410:50 AM

## 2023-09-08 LAB — TSH: TSH: 2.279 u[IU]/mL (ref 0.350–4.500)

## 2023-09-08 LAB — ERYTHROPOIETIN: Erythropoietin: 14.8 m[IU]/mL (ref 2.6–18.5)

## 2023-09-09 ENCOUNTER — Telehealth (HOSPITAL_BASED_OUTPATIENT_CLINIC_OR_DEPARTMENT_OTHER): Payer: Self-pay

## 2023-09-14 ENCOUNTER — Encounter: Payer: Self-pay | Admitting: Sports Medicine

## 2023-09-14 DIAGNOSIS — I10 Essential (primary) hypertension: Secondary | ICD-10-CM

## 2023-09-14 MED ORDER — AMLODIPINE BESYLATE 10 MG PO TABS: 10.0000 mg | ORAL_TABLET | Freq: Every day | ORAL | 1 refills | Status: DC

## 2023-09-14 MED ORDER — BENAZEPRIL HCL 40 MG PO TABS: 40.0000 mg | ORAL_TABLET | Freq: Every day | ORAL | 1 refills | Status: DC

## 2023-10-23 ENCOUNTER — Ambulatory Visit (HOSPITAL_BASED_OUTPATIENT_CLINIC_OR_DEPARTMENT_OTHER)
Admission: RE | Admit: 2023-10-23 | Discharge: 2023-10-23 | Disposition: A | Discharge status: Home / Self Care | Payer: Medicare HMO | Source: Ambulatory Visit | Attending: Medical Oncology | Admitting: Medical Oncology

## 2023-10-23 DIAGNOSIS — Z9049 Acquired absence of other specified parts of digestive tract: Secondary | ICD-10-CM | POA: Diagnosis not present

## 2023-10-27 ENCOUNTER — Other Ambulatory Visit: Payer: Self-pay | Admitting: Sports Medicine

## 2023-10-27 DIAGNOSIS — E1122 Type 2 diabetes mellitus with diabetic chronic kidney disease: Secondary | ICD-10-CM

## 2023-11-09 ENCOUNTER — Inpatient Hospital Stay: Payer: Medicare HMO | Attending: Medical Oncology

## 2023-11-09 ENCOUNTER — Encounter: Payer: Self-pay | Admitting: Medical Oncology

## 2023-11-09 ENCOUNTER — Inpatient Hospital Stay: Payer: Medicare HMO | Admitting: Medical Oncology

## 2023-11-09 VITALS — BP 122/49 | HR 58 | Temp 98.1°F | Resp 19 | Ht 70.0 in | Wt 220.0 lb

## 2023-11-09 DIAGNOSIS — Z79899 Other long term (current) drug therapy: Secondary | ICD-10-CM | POA: Diagnosis not present

## 2023-11-09 DIAGNOSIS — L309 Dermatitis, unspecified: Secondary | ICD-10-CM

## 2023-11-09 DIAGNOSIS — Z7982 Long term (current) use of aspirin: Secondary | ICD-10-CM | POA: Insufficient documentation

## 2023-11-09 DIAGNOSIS — D649 Anemia, unspecified: Secondary | ICD-10-CM | POA: Diagnosis not present

## 2023-11-09 LAB — IRON AND IRON BINDING CAPACITY (CC-WL,HP ONLY)
Iron: 93 ug/dL (ref 45–182)
Saturation Ratios: 27 % (ref 17.9–39.5)
TIBC: 343 ug/dL (ref 250–450)
UIBC: 250 ug/dL (ref 117–376)

## 2023-11-09 LAB — CMP (CANCER CENTER ONLY)
ALT: 37 U/L (ref 0–44)
AST: 22 U/L (ref 15–41)
Albumin: 4.4 g/dL (ref 3.5–5.0)
Alkaline Phosphatase: 47 U/L (ref 38–126)
Anion gap: 9 (ref 5–15)
BUN: 24 mg/dL — ABNORMAL HIGH (ref 8–23)
CO2: 25 mmol/L (ref 22–32)
Calcium: 9.3 mg/dL (ref 8.9–10.3)
Chloride: 106 mmol/L (ref 98–111)
Creatinine: 1.08 mg/dL (ref 0.61–1.24)
GFR, Estimated: >60 mL/min (ref >60)
Glucose, Bld: 260 mg/dL — ABNORMAL HIGH (ref 70–99)
Potassium: 4.2 mmol/L (ref 3.5–5.1)
Sodium: 140 mmol/L (ref 135–145)
Total Bilirubin: 0.6 mg/dL (ref 0.0–1.2)
Total Protein: 6.5 g/dL (ref 6.5–8.1)

## 2023-11-09 LAB — CBC
HCT: 39.4 % (ref 39.0–52.0)
Hemoglobin: 13.4 g/dL (ref 13.0–17.0)
MCH: 33.3 pg (ref 26.0–34.0)
MCHC: 34 g/dL (ref 30.0–36.0)
MCV: 98 fL (ref 80.0–100.0)
Platelets: 155 10*3/uL (ref 150–400)
RBC: 4.02 MIL/uL — ABNORMAL LOW (ref 4.22–5.81)
RDW: 13.2 % (ref 11.5–15.5)
WBC: 7.3 10*3/uL (ref 4.0–10.5)
nRBC: 0 % (ref 0.0–0.2)

## 2023-11-09 LAB — FERRITIN: Ferritin: 122 ng/mL (ref 24–336)

## 2023-11-09 MED ORDER — TRIAMCINOLONE ACETONIDE 0.5 % EX OINT: 1.0000 | TOPICAL_OINTMENT | Freq: Two times a day (BID) | CUTANEOUS | 0 refills | Status: AC

## 2023-11-09 NOTE — Progress Notes (Signed)
Hematology and Oncology Follow Up Visit  Thomas Leonard [id number] July 23, 1946 78 y.o. 11/09/2023   Principle Diagnosis:  Hereditary hemochromatosis- C282Y -heterozygous New anemia  Current Therapy:   Phlebotomy to maintain ferritin less than 100 and iron saturation less than 30%     Interim History:  Thomas Leonard is back for follow-up. He is heterozygous for one of the major mutations- C282Y.  At his 07/30/2023 visit ferritin was 330 with iron saturation of 37%. He had a new anemia with a Hgb of 12.8 and GI referral was recommended. He has not followed through yet with seeing GI. Last colonoscopy was 8 years ago. No visualized bleeding.   He was also found to be deficient in B12. He was started on oral B12. He has noticed some improvement in his energy levels.   No headaches,bowel changes. No unintentional weight loss or night sweats.   ETOH use is seldom He is UTD on his eye exams  His last abdominal US was on 10/2023 showing hepatic steatosis   There has been no bleeding to his knowledge: denies epistaxis, gingivitis, hemoptysis, hematemesis, hematuria, melena, excessive bruising, blood donation. He is not UTD on his colonoscopy.   Overall, his performance status is ECOG 0.  Wt Readings from Last 3 Encounters:  11/09/23 220 lb (99.8 kg)  09/07/23 215 lb (97.5 kg)  07/30/23 216 lb (98 kg)     Medications:  Current Outpatient Medications:    ACCU-CHEK AVIVA PLUS test strip, TEST DAILY., Disp: 100 strip, Rfl: 3   Accu-Chek Softclix Lancets lancets, USE DAILY., Disp: 100 each, Rfl: 3   Alcohol Swabs (DROPSAFE ALCOHOL PREP) 70 % PADS, USE DAILY., Disp: 100 each, Rfl: 3   allopurinol (ZYLOPRIM) 100 MG tablet, TAKE 4 TABLETS (400 MG TOTAL) BY MOUTH DAILY., Disp: 360 tablet, Rfl: 3   amLODipine (NORVASC) 10 MG tablet, Take 1 tablet (10 mg total) by mouth daily., Disp: 90 tablet, Rfl: 1   aspirin 81 MG chewable tablet, Chew 81 mg by mouth daily., Disp: , Rfl:    atorvastatin  (LIPITOR) 10 MG tablet, TAKE 1 TABLET EVERY DAY, Disp: 90 tablet, Rfl: 1   benazepril (LOTENSIN) 40 MG tablet, Take 1 tablet (40 mg total) by mouth daily., Disp: 90 tablet, Rfl: 1   carvedilol (COREG) 25 MG tablet, TAKE 1 TABLET (25 MG TOTAL) BY MOUTH 2 (TWO) TIMES DAILY WITH A MEAL., Disp: 180 tablet, Rfl: 3   Cholecalciferol (VITAMIN D3) 25 MCG (1000 UT) CAPS, Take 2 capsules by mouth daily., Disp: , Rfl:    esomeprazole (NEXIUM) 20 MG capsule, Take 20 mg by mouth daily at 12 noon., Disp: , Rfl:    glipiZIDE (GLUCOTROL) 10 MG tablet, TAKE 1 TABLET (10 MG TOTAL) BY MOUTH TWICE A DAY BEFORE A MEAL, Disp: 180 tablet, Rfl: 3   hydrochlorothiazide (HYDRODIURIL) 25 MG tablet, Take 1 tablet (25 mg total) by mouth daily., Disp: 90 tablet, Rfl: 3   metFORMIN (GLUCOPHAGE) 1000 MG tablet, Take 1 tablet (1,000 mg total) by mouth 2 (two) times daily with a meal., Disp: 180 tablet, Rfl: 3   Omega-3 Fatty Acids (FISH OIL) 1000 MG CAPS, Take 2 capsules by mouth daily., Disp: , Rfl:    triamcinolone ointment (KENALOG) 0.5 %, Apply 1 Application topically 2 (two) times daily., Disp: 30 g, Rfl: 0  Allergies: No Known Allergies  Past Medical History, Surgical history, Social history, and Family History were reviewed and updated.  Review of Systems: Review of Systems  Constitutional: Negative.  HENT:  Negative.    Eyes: Negative.   Respiratory: Negative.    Cardiovascular: Negative.   Gastrointestinal: Negative.   Endocrine: Negative.   Genitourinary: Negative.    Musculoskeletal: Negative.   Skin: Negative.   Neurological: Negative.   Hematological: Negative.   Psychiatric/Behavioral: Negative.      Physical Exam:  height is 5\' 10"  (1.778 m) and weight is 220 lb (99.8 kg). His oral temperature is 98.1 F (36.7 C). His blood pressure is 122/49 (abnormal) and his pulse is 58 (abnormal). His respiration is 19 and oxygen saturation is 96%.   Wt Readings from Last 3 Encounters:  11/09/23 220 lb (99.8  kg)  09/07/23 215 lb (97.5 kg)  07/30/23 216 lb (98 kg)    Physical Exam Vitals reviewed.  HENT:     Head: Normocephalic and atraumatic.  Eyes:     Pupils: Pupils are equal, round, and reactive to light.  Cardiovascular:     Rate and Rhythm: Normal rate and regular rhythm.     Heart sounds: Normal heart sounds.  Pulmonary:     Effort: Pulmonary effort is normal.     Breath sounds: Normal breath sounds.  Abdominal:     General: Bowel sounds are normal.     Palpations: Abdomen is soft.  Musculoskeletal:        General: No tenderness or deformity. Normal range of motion.     Cervical back: Normal range of motion.  Lymphadenopathy:     Cervical: No cervical adenopathy.  Skin:    General: Skin is warm and dry.     Findings: Rash (dermatitis of the left fingers without discharge or significant erythema) present. No erythema.  Neurological:     Mental Status: He is alert and oriented to person, place, and time.  Psychiatric:        Behavior: Behavior normal.        Thought Content: Thought content normal.        Judgment: Judgment normal.    Lab Results  Component Value Date   WBC 7.3 11/09/2023   HGB 13.4 11/09/2023   HCT 39.4 11/09/2023   MCV 98.0 11/09/2023   PLT 155 11/09/2023     Chemistry      Component Value Date/Time   NA 140 11/09/2023 0958   K 4.2 11/09/2023 0958   CL 106 11/09/2023 0958   CO2 25 11/09/2023 0958   BUN 24 (H) 11/09/2023 0958   CREATININE 1.08 11/09/2023 0958   CREATININE 1.12 11/06/2022 1023      Component Value Date/Time   CALCIUM 9.3 11/09/2023 0958   ALKPHOS 47 11/09/2023 0958   AST 22 11/09/2023 0958   ALT 37 11/09/2023 0958   BILITOT 0.6 11/09/2023 0958     Encounter Diagnoses  Name Primary?   Dermatitis Yes   Hereditary hemochromatosis (HCC)     Impression and Plan: Thomas Leonard is a very nice 78 year old white male with heterozygous hereditary hemochromatosis.  He is originally from [place].  He served in [employer].    Iron studies pending at time of visit. Triamcinolone sent in for his hands. We discussed applying sparingly to affected areas for up to 2 weeks. Dermatology if symptoms continue. Discussed general dermatitis advisement.  He will work on his DM2 as his blood sugar is up today.  Hgb is 13.4 with a HCT of 39.4  RTC 2 months APP, labs (CBC, CMP, iron, ferritin)-[place]  [name], PA-C 1/27/202510:47 AM

## 2023-11-11 ENCOUNTER — Encounter: Payer: Self-pay | Admitting: Medical Oncology

## 2023-11-30 ENCOUNTER — Ambulatory Visit: Payer: Medicare HMO | Admitting: Sports Medicine

## 2023-12-03 ENCOUNTER — Ambulatory Visit: Payer: Medicare HMO | Admitting: Sports Medicine

## 2023-12-08 ENCOUNTER — Ambulatory Visit (INDEPENDENT_AMBULATORY_CARE_PROVIDER_SITE_OTHER): Payer: Medicare HMO | Admitting: Sports Medicine

## 2023-12-08 VITALS — BP 118/70 | HR 60

## 2023-12-08 DIAGNOSIS — E1122 Type 2 diabetes mellitus with diabetic chronic kidney disease: Secondary | ICD-10-CM | POA: Diagnosis not present

## 2023-12-08 DIAGNOSIS — N182 Chronic kidney disease, stage 2 (mild): Secondary | ICD-10-CM | POA: Diagnosis not present

## 2023-12-08 DIAGNOSIS — Z7984 Long term (current) use of oral hypoglycemic drugs: Secondary | ICD-10-CM

## 2023-12-08 LAB — POCT UA - MICROALBUMIN
Albumin/Creatinine Ratio, Urine, POC: <30
Creatinine, POC: 200 mg/dL
Microalbumin Ur, POC: 30 mg/L

## 2023-12-08 LAB — POCT GLYCOSYLATED HEMOGLOBIN (HGB A1C): Hemoglobin A1C: 6.3 % — AB (ref 4.0–5.6)

## 2023-12-08 NOTE — Assessment & Plan Note (Addendum)
 This is an extremely pleasant 78 year old male, he returns, A1c has continued to improve, 7.3% early last year, 6.6% late last year, down to 6.3% today. Urine microalbumin creatinine ratio is normal today. He has attributed this to a better diet, lower carb intake. We will continue metformin 1000 mg twice daily, we will continue with his glipizide 10 mg twice daily. Return in 6 months.

## 2023-12-08 NOTE — Progress Notes (Signed)
    Procedures performed today:    None.  Independent interpretation of notes and tests performed by another provider:   None.  Brief History, Exam, Impression, and Recommendations:    Type 2 diabetes mellitus with diabetic chronic kidney disease (HCC) This is an extremely pleasant 78 year old male, he returns, A1c has continued to improve, 7.3% early last year, 6.6% late last year, down to 6.3% today. Urine microalbumin creatinine ratio is normal today. He has attributed this to a better diet, lower carb intake. We will continue metformin 1000 mg twice daily, we will continue with his glipizide 10 mg twice daily. Return in 6 months.  I spent 30 minutes of total time managing this patient today, this includes chart review, face to face, and non-face to face time.  ____________________________________________ Ihor Austin. Benjamin Stain, M.D., ABFM., CAQSM., AME. Primary Care and Sports Medicine [facility]  Adjunct Professor of Family Medicine  [place] [employer]  Restaurant manager, fast food

## 2024-01-04 ENCOUNTER — Inpatient Hospital Stay: Payer: Medicare HMO | Attending: Medical Oncology

## 2024-01-04 ENCOUNTER — Inpatient Hospital Stay: Payer: Medicare HMO | Admitting: Medical Oncology

## 2024-01-04 DIAGNOSIS — K760 Fatty (change of) liver, not elsewhere classified: Secondary | ICD-10-CM | POA: Diagnosis not present

## 2024-01-04 DIAGNOSIS — D519 Vitamin B12 deficiency anemia, unspecified: Secondary | ICD-10-CM

## 2024-01-04 DIAGNOSIS — D649 Anemia, unspecified: Secondary | ICD-10-CM | POA: Insufficient documentation

## 2024-01-04 DIAGNOSIS — E538 Deficiency of other specified B group vitamins: Secondary | ICD-10-CM | POA: Diagnosis not present

## 2024-01-04 LAB — FERRITIN: Ferritin: 163 ng/mL (ref 24–336)

## 2024-01-04 LAB — CMP (CANCER CENTER ONLY)
ALT: 32 U/L (ref 0–44)
AST: 23 U/L (ref 15–41)
Albumin: 4.9 g/dL (ref 3.5–5.0)
Alkaline Phosphatase: 48 U/L (ref 38–126)
Anion gap: 10 (ref 5–15)
BUN: 33 mg/dL — ABNORMAL HIGH (ref 8–23)
CO2: 27 mmol/L (ref 22–32)
Calcium: 9.7 mg/dL (ref 8.9–10.3)
Chloride: 106 mmol/L (ref 98–111)
Creatinine: 1.17 mg/dL (ref 0.61–1.24)
GFR, Estimated: >60 mL/min (ref >60)
Glucose, Bld: 110 mg/dL — ABNORMAL HIGH (ref 70–99)
Potassium: 4.3 mmol/L (ref 3.5–5.1)
Sodium: 143 mmol/L (ref 135–145)
Total Bilirubin: 0.5 mg/dL (ref 0.0–1.2)
Total Protein: 7.4 g/dL (ref 6.5–8.1)

## 2024-01-04 LAB — CBC
HCT: 41.4 % (ref 39.0–52.0)
Hemoglobin: 13.9 g/dL (ref 13.0–17.0)
MCH: 33.3 pg (ref 26.0–34.0)
MCHC: 33.6 g/dL (ref 30.0–36.0)
MCV: 99.3 fL (ref 80.0–100.0)
Platelets: 182 10*3/uL (ref 150–400)
RBC: 4.17 MIL/uL — ABNORMAL LOW (ref 4.22–5.81)
RDW: 13.2 % (ref 11.5–15.5)
WBC: 8.8 10*3/uL (ref 4.0–10.5)
nRBC: 0 % (ref 0.0–0.2)

## 2024-01-04 NOTE — Progress Notes (Unsigned)
 Hematology and Oncology Follow Up Visit  Thomas Leonard [id number] 11/06/1945 78 y.o. 01/05/2024   Principle Diagnosis:  Hereditary hemochromatosis- C282Y -heterozygous New anemia  Current Therapy:   Phlebotomy to maintain ferritin less than 100 and iron saturation less than 30% Oral B12 1000 mcg once daily      Interim History:  Thomas Leonard is back for follow-up. He is heterozygous for one of the major mutations- C282Y.  At his 07/30/2023 visit ferritin was 330 with iron saturation of 37%. He had a new anemia with a Hgb of 12.8 and GI referral was recommended. He has not followed through yet with seeing GI. Last colonoscopy was 8 years ago. No visualized bleeding. He was also found to be deficient in B12. He was started on oral B12.  Today he reports that he is feeling well. He has no concerns. Energy continues to be improved.   No headaches,bowel changes. No unintentional weight loss or night sweats.   ETOH use is seldom He is UTD on his eye exams  His last abdominal US was on 10/2023 showing hepatic steatosis   There has been no bleeding to his knowledge: denies epistaxis, gingivitis, hemoptysis, hematemesis, hematuria, melena, excessive bruising, blood donation. He is not UTD on his colonoscopy.   Overall, his performance status is ECOG 0.  Wt Readings from Last 3 Encounters:  01/04/24 214 lb (97.1 kg)  11/09/23 220 lb (99.8 kg)  09/07/23 215 lb (97.5 kg)     Medications:  Current Outpatient Medications:    ACCU-CHEK AVIVA PLUS test strip, TEST DAILY., Disp: 100 strip, Rfl: 3   Accu-Chek Softclix Lancets lancets, USE DAILY., Disp: 100 each, Rfl: 3   Alcohol Swabs (DROPSAFE ALCOHOL PREP) 70 % PADS, USE DAILY., Disp: 100 each, Rfl: 3   allopurinol (ZYLOPRIM) 100 MG tablet, TAKE 4 TABLETS (400 MG TOTAL) BY MOUTH DAILY., Disp: 360 tablet, Rfl: 3   amLODipine (NORVASC) 10 MG tablet, Take 1 tablet (10 mg total) by mouth daily., Disp: 90 tablet, Rfl: 1   aspirin 81 MG  chewable tablet, Chew 81 mg by mouth daily., Disp: , Rfl:    atorvastatin (LIPITOR) 10 MG tablet, TAKE 1 TABLET EVERY DAY, Disp: 90 tablet, Rfl: 1   benazepril (LOTENSIN) 40 MG tablet, Take 1 tablet (40 mg total) by mouth daily., Disp: 90 tablet, Rfl: 1   carvedilol (COREG) 25 MG tablet, TAKE 1 TABLET (25 MG TOTAL) BY MOUTH 2 (TWO) TIMES DAILY WITH A MEAL., Disp: 180 tablet, Rfl: 3   Cholecalciferol (VITAMIN D3) 25 MCG (1000 UT) CAPS, Take 2 capsules by mouth daily., Disp: , Rfl:    esomeprazole (NEXIUM) 20 MG capsule, Take 20 mg by mouth daily at 12 noon., Disp: , Rfl:    glipiZIDE (GLUCOTROL) 10 MG tablet, TAKE 1 TABLET (10 MG TOTAL) BY MOUTH TWICE A DAY BEFORE A MEAL, Disp: 180 tablet, Rfl: 3   hydrochlorothiazide (HYDRODIURIL) 25 MG tablet, Take 1 tablet (25 mg total) by mouth daily., Disp: 90 tablet, Rfl: 3   metFORMIN (GLUCOPHAGE) 1000 MG tablet, Take 1 tablet (1,000 mg total) by mouth 2 (two) times daily with a meal., Disp: 180 tablet, Rfl: 3   Omega-3 Fatty Acids (FISH OIL) 1000 MG CAPS, Take 2 capsules by mouth daily., Disp: , Rfl:    triamcinolone ointment (KENALOG) 0.5 %, Apply 1 Application topically 2 (two) times daily., Disp: 30 g, Rfl: 0  Allergies: No Known Allergies  Past Medical History, Surgical history, Social history, and Family History were  reviewed and updated.  Review of Systems: Review of Systems  Constitutional: Negative.   HENT:  Negative.    Eyes: Negative.   Respiratory: Negative.    Cardiovascular: Negative.   Gastrointestinal: Negative.   Endocrine: Negative.   Genitourinary: Negative.    Musculoskeletal: Negative.   Skin: Negative.   Neurological: Negative.   Hematological: Negative.   Psychiatric/Behavioral: Negative.      Physical Exam:  height is 5\' 10"  (1.778 m) and weight is 214 lb (97.1 kg). His blood pressure is 128/62 and his pulse is 49 (abnormal). His respiration is 18 and oxygen saturation is 100%.   Wt Readings from Last 3 Encounters:   01/04/24 214 lb (97.1 kg)  11/09/23 220 lb (99.8 kg)  09/07/23 215 lb (97.5 kg)    Physical Exam Vitals reviewed.  HENT:     Head: Normocephalic and atraumatic.  Eyes:     Pupils: Pupils are equal, round, and reactive to light.  Cardiovascular:     Rate and Rhythm: Normal rate and regular rhythm.     Heart sounds: Normal heart sounds.  Pulmonary:     Effort: Pulmonary effort is normal.     Breath sounds: Normal breath sounds.  Abdominal:     General: Bowel sounds are normal.     Palpations: Abdomen is soft.  Musculoskeletal:        General: No tenderness or deformity. Normal range of motion.     Cervical back: Normal range of motion.  Lymphadenopathy:     Cervical: No cervical adenopathy.  Skin:    General: Skin is warm and dry.     Findings: Rash (dermatitis of the left fingers without discharge or significant erythema) present. No erythema.  Neurological:     Mental Status: He is alert and oriented to person, place, and time.  Psychiatric:        Behavior: Behavior normal.        Thought Content: Thought content normal.        Judgment: Judgment normal.    Lab Results  Component Value Date   WBC 8.8 01/04/2024   HGB 13.9 01/04/2024   HCT 41.4 01/04/2024   MCV 99.3 01/04/2024   PLT 182 01/04/2024     Chemistry      Component Value Date/Time   NA 143 01/04/2024 1331   K 4.3 01/04/2024 1331   CL 106 01/04/2024 1331   CO2 27 01/04/2024 1331   BUN 33 (H) 01/04/2024 1331   CREATININE 1.17 01/04/2024 1331   CREATININE 1.12 11/06/2022 1023      Component Value Date/Time   CALCIUM 9.7 01/04/2024 1331   ALKPHOS 48 01/04/2024 1331   AST 23 01/04/2024 1331   ALT 32 01/04/2024 1331   BILITOT 0.5 01/04/2024 1331     Encounter Diagnoses  Name Primary?   Hereditary hemochromatosis (HCC) Yes   Anemia due to vitamin B12 deficiency, unspecified B12 deficiency type     Impression and Plan: Thomas Leonard is a very nice 78 year old white male with heterozygous  hereditary hemochromatosis.  He is originally from [place].  He served in [employer]. Last phlebotomy was performed about 2.5 years ago. He was found to have one low Hgb reading of 12.8 on 07/30/2023. He was subsequently found to have B12 deficiency and was started on oral B12 supplement. Counts have improved.    Hgb is 13.9 with a HCT of 41.4 B12 levels pending at time of visit.  Iron studies pending at time  of visit.  RTC 3 months APP, labs (CBC, CMP, iron, ferritin, B12)-[place]  Brand Males [place], PA-C 3/25/20259:56 AM

## 2024-01-05 ENCOUNTER — Encounter: Payer: Self-pay | Admitting: Medical Oncology

## 2024-01-05 LAB — IRON AND IRON BINDING CAPACITY (CC-WL,HP ONLY)
Iron: 78 ug/dL (ref 45–182)
Saturation Ratios: 22 % (ref 17.9–39.5)
TIBC: 351 ug/dL (ref 250–450)
UIBC: 273 ug/dL (ref 117–376)

## 2024-01-11 ENCOUNTER — Other Ambulatory Visit: Payer: Self-pay | Admitting: Sports Medicine

## 2024-01-11 DIAGNOSIS — E1122 Type 2 diabetes mellitus with diabetic chronic kidney disease: Secondary | ICD-10-CM

## 2024-01-13 ENCOUNTER — Other Ambulatory Visit: Payer: Self-pay | Admitting: Sports Medicine

## 2024-02-15 ENCOUNTER — Other Ambulatory Visit: Payer: Self-pay | Admitting: Sports Medicine

## 2024-02-15 DIAGNOSIS — I10 Essential (primary) hypertension: Secondary | ICD-10-CM

## 2024-02-26 ENCOUNTER — Other Ambulatory Visit: Payer: Self-pay | Admitting: Sports Medicine

## 2024-02-28 ENCOUNTER — Encounter: Payer: Self-pay | Admitting: Sports Medicine

## 2024-02-29 ENCOUNTER — Other Ambulatory Visit: Payer: Self-pay

## 2024-02-29 MED ORDER — ATORVASTATIN CALCIUM 10 MG PO TABS: 10.0000 mg | ORAL_TABLET | Freq: Every day | ORAL | 1 refills | Status: DC

## 2024-02-29 NOTE — Telephone Encounter (Signed)
 I Did not change to preferred pharmacy on request - so resent to correct pharmacy- [facility] [address] Thomas Leonard at [employer].

## 2024-02-29 NOTE — Telephone Encounter (Signed)
 Requesting rx rf of Atorvastatin  10mg   Last written 06/20/2022 Last lipid panel 11/06/2022 Last OV 12/08/2023 Upcoming appt 06/06/2024

## 2024-04-04 ENCOUNTER — Other Ambulatory Visit

## 2024-04-04 ENCOUNTER — Ambulatory Visit: Admitting: Medical Oncology

## 2024-04-11 ENCOUNTER — Inpatient Hospital Stay: Attending: Medical Oncology

## 2024-04-11 ENCOUNTER — Encounter: Payer: Self-pay | Admitting: Medical Oncology

## 2024-04-11 ENCOUNTER — Inpatient Hospital Stay (HOSPITAL_BASED_OUTPATIENT_CLINIC_OR_DEPARTMENT_OTHER): Admitting: Medical Oncology

## 2024-04-11 ENCOUNTER — Ambulatory Visit: Payer: Self-pay | Admitting: Medical Oncology

## 2024-04-11 DIAGNOSIS — D519 Vitamin B12 deficiency anemia, unspecified: Secondary | ICD-10-CM | POA: Diagnosis not present

## 2024-04-11 DIAGNOSIS — Z79899 Other long term (current) drug therapy: Secondary | ICD-10-CM | POA: Insufficient documentation

## 2024-04-11 LAB — CMP (CANCER CENTER ONLY)
ALT: 38 U/L (ref 0–44)
AST: 21 U/L (ref 15–41)
Albumin: 4.4 g/dL (ref 3.5–5.0)
Alkaline Phosphatase: 50 U/L (ref 38–126)
Anion gap: 11 (ref 5–15)
BUN: 28 mg/dL — ABNORMAL HIGH (ref 8–23)
CO2: 23 mmol/L (ref 22–32)
Calcium: 9.4 mg/dL (ref 8.9–10.3)
Chloride: 102 mmol/L (ref 98–111)
Creatinine: 1.11 mg/dL (ref 0.61–1.24)
GFR, Estimated: >60 mL/min (ref >60)
Glucose, Bld: 305 mg/dL — ABNORMAL HIGH (ref 70–99)
Potassium: 4 mmol/L (ref 3.5–5.1)
Sodium: 136 mmol/L (ref 135–145)
Total Bilirubin: 0.6 mg/dL (ref 0.0–1.2)
Total Protein: 6.9 g/dL (ref 6.5–8.1)

## 2024-04-11 LAB — CBC WITH DIFFERENTIAL (CANCER CENTER ONLY)
Abs Immature Granulocytes: 0.07 10*3/uL (ref 0.00–0.07)
Basophils Absolute: 0.1 10*3/uL (ref 0.0–0.1)
Basophils Relative: 1 %
Eosinophils Absolute: 0.2 10*3/uL (ref 0.0–0.5)
Eosinophils Relative: 3 %
HCT: 38.5 % — ABNORMAL LOW (ref 39.0–52.0)
Hemoglobin: 13.2 g/dL (ref 13.0–17.0)
Immature Granulocytes: 1 %
Lymphocytes Relative: 21 %
Lymphs Abs: 1.6 10*3/uL (ref 0.7–4.0)
MCH: 32.9 pg (ref 26.0–34.0)
MCHC: 34.3 g/dL (ref 30.0–36.0)
MCV: 96 fL (ref 80.0–100.0)
Monocytes Absolute: 0.5 10*3/uL (ref 0.1–1.0)
Monocytes Relative: 7 %
Neutro Abs: 5.1 10*3/uL (ref 1.7–7.7)
Neutrophils Relative %: 67 %
Platelet Count: 151 10*3/uL (ref 150–400)
RBC: 4.01 MIL/uL — ABNORMAL LOW (ref 4.22–5.81)
RDW: 13.3 % (ref 11.5–15.5)
WBC Count: 7.6 10*3/uL (ref 4.0–10.5)
nRBC: 0 % (ref 0.0–0.2)

## 2024-04-11 LAB — RETIC PANEL
Immature Retic Fract: 15.8 % (ref 2.3–15.9)
RBC.: 3.99 MIL/uL — ABNORMAL LOW (ref 4.22–5.81)
Retic Count, Absolute: 57.5 10*3/uL (ref 19.0–186.0)
Retic Ct Pct: 1.4 % (ref 0.4–3.1)
Reticulocyte Hemoglobin: 37 pg (ref >27.9)

## 2024-04-11 LAB — IRON AND IRON BINDING CAPACITY (CC-WL,HP ONLY)
Iron: 115 ug/dL (ref 45–182)
Saturation Ratios: 35 % (ref 17.9–39.5)
TIBC: 325 ug/dL (ref 250–450)
UIBC: 210 ug/dL (ref 117–376)

## 2024-04-11 LAB — VITAMIN B12: Vitamin B-12: 304 pg/mL (ref 180–914)

## 2024-04-11 LAB — FERRITIN: Ferritin: 188 ng/mL (ref 24–336)

## 2024-04-11 NOTE — Progress Notes (Signed)
 Hematology and Oncology Follow Up Visit  Thomas Leonard [id number] Nov 04, 1945 78 y.o. 04/11/2024   Principle Diagnosis:  Hereditary hemochromatosis- C282Y -heterozygous New anemia  Current Therapy:   Phlebotomy to maintain ferritin less than 100 and iron saturation less than 30% Oral B12 1000 mcg once daily      Interim History:  Thomas Leonard is back for follow-up. He is heterozygous for one of the major mutations- C282Y.  At his 07/30/2023 visit ferritin was 330 with iron saturation of 37%. He had a new anemia with a Hgb of 12.8 and GI referral was recommended. He has seen GI since- had an endoscopy which was normal per patient but colonoscopy was cancelled as he did not tolerate the colonoscopy prep. Last colonoscopy was about 7-8 years ago. They found 7 polyps that he reports as non-cancerous. He was found to have B12 deficiency and has been started on oral once daily supplementation. This has normalized his values as well as having improved his SOB.   He states that since his last visit he has been ok. He recently got back from a vacation to [place]. This was good but he got off his normal eating pattern.   No headaches,bowel changes. No unintentional weight loss or night sweats.   ETOH use is seldom He is UTD on his eye exams  His last abdominal US  was on 10/2023 showing hepatic steatosis   There has been no bleeding to his knowledge: denies epistaxis, gingivitis, hemoptysis, hematemesis, hematuria, melena, excessive bruising, blood donation. He is not UTD on his colonoscopy.   Overall, his performance status is ECOG 0.  Wt Readings from Last 3 Encounters:  04/11/24 222 lb (100.7 kg)  01/04/24 214 lb (97.1 kg)  11/09/23 220 lb (99.8 kg)     Medications:  Current Outpatient Medications:    ACCU-CHEK AVIVA PLUS test strip, TEST BLOOD SUGAR EVERY DAY, Disp: 100 strip, Rfl: 3   Accu-Chek Softclix Lancets lancets, USE DAILY., Disp: 100 each, Rfl: 3   Alcohol Swabs  (DROPSAFE ALCOHOL PREP) 70 % PADS, USE DAILY., Disp: 100 each, Rfl: 3   allopurinol  (ZYLOPRIM ) 100 MG tablet, TAKE 4 TABLETS EVERY DAY, Disp: 360 tablet, Rfl: 3   amLODipine  (NORVASC ) 10 MG tablet, TAKE 1 TABLET EVERY DAY, Disp: 90 tablet, Rfl: 3   aspirin 81 MG chewable tablet, Chew 81 mg by mouth daily., Disp: , Rfl:    atorvastatin  (LIPITOR) 10 MG tablet, Take 1 tablet (10 mg total) by mouth daily., Disp: 90 tablet, Rfl: 1   benazepril  (LOTENSIN ) 40 MG tablet, TAKE 1 TABLET EVERY DAY, Disp: 90 tablet, Rfl: 3   carvedilol  (COREG ) 25 MG tablet, TAKE 1 TABLET (25 MG TOTAL) BY MOUTH 2 (TWO) TIMES DAILY WITH A MEAL., Disp: 180 tablet, Rfl: 3   Cholecalciferol (VITAMIN D3) 25 MCG (1000 UT) CAPS, Take 2 capsules by mouth daily., Disp: , Rfl:    esomeprazole (NEXIUM) 20 MG capsule, Take 20 mg by mouth daily at 12 noon., Disp: , Rfl:    glipiZIDE  (GLUCOTROL ) 10 MG tablet, TAKE 1 TABLET (10 MG TOTAL) BY MOUTH TWICE A DAY BEFORE A MEAL, Disp: 180 tablet, Rfl: 3   hydrochlorothiazide  (HYDRODIURIL ) 25 MG tablet, Take 1 tablet (25 mg total) by mouth daily., Disp: 90 tablet, Rfl: 3   metFORMIN  (GLUCOPHAGE ) 1000 MG tablet, TAKE 1 TABLET TWICE DAILY WITH MEALS, Disp: 180 tablet, Rfl: 3   Omega-3 Fatty Acids (FISH OIL) 1000 MG CAPS, Take 2 capsules by mouth daily., Disp: , Rfl:  triamcinolone  ointment (KENALOG ) 0.5 %, Apply 1 Application topically 2 (two) times daily., Disp: 30 g, Rfl: 0  Allergies: No Known Allergies  Past Medical History, Surgical history, Social history, and Family History were reviewed and updated.  Review of Systems: Review of Systems  Constitutional: Negative.   HENT:  Negative.    Eyes: Negative.   Respiratory: Negative.    Cardiovascular: Negative.   Gastrointestinal: Negative.   Endocrine: Negative.   Genitourinary: Negative.    Musculoskeletal: Negative.   Skin: Negative.   Neurological: Negative.   Hematological: Negative.   Psychiatric/Behavioral: Negative.       Physical Exam:  height is 5' 10 (1.778 m) and weight is 222 lb (100.7 kg). His oral temperature is 97.8 F (36.6 C). His blood pressure is 122/62 and his pulse is 60. His respiration is 18 and oxygen saturation is 98%.   Wt Readings from Last 3 Encounters:  04/11/24 222 lb (100.7 kg)  01/04/24 214 lb (97.1 kg)  11/09/23 220 lb (99.8 kg)    Physical Exam Vitals reviewed.  HENT:     Head: Normocephalic and atraumatic.   Eyes:     Pupils: Pupils are equal, round, and reactive to light.    Cardiovascular:     Rate and Rhythm: Normal rate and regular rhythm.     Heart sounds: Normal heart sounds.  Pulmonary:     Effort: Pulmonary effort is normal.     Breath sounds: Normal breath sounds.  Abdominal:     General: Bowel sounds are normal.     Palpations: Abdomen is soft.   Musculoskeletal:        General: No tenderness or deformity. Normal range of motion.     Cervical back: Normal range of motion.  Lymphadenopathy:     Cervical: No cervical adenopathy.   Skin:    General: Skin is warm and dry.     Findings: No erythema or rash.   Neurological:     Mental Status: He is alert and oriented to person, place, and time.   Psychiatric:        Behavior: Behavior normal.        Thought Content: Thought content normal.        Judgment: Judgment normal.    Lab Results  Component Value Date   WBC 7.6 04/11/2024   HGB 13.2 04/11/2024   HCT 38.5 (L) 04/11/2024   MCV 96.0 04/11/2024   PLT 151 04/11/2024     Chemistry      Component Value Date/Time   NA 136 04/11/2024 0859   K 4.0 04/11/2024 0859   CL 102 04/11/2024 0859   CO2 23 04/11/2024 0859   BUN 28 (H) 04/11/2024 0859   CREATININE 1.11 04/11/2024 0859   CREATININE 1.12 11/06/2022 1023      Component Value Date/Time   CALCIUM  9.4 04/11/2024 0859   ALKPHOS 50 04/11/2024 0859   AST 21 04/11/2024 0859   ALT 38 04/11/2024 0859   BILITOT 0.6 04/11/2024 0859     Encounter Diagnoses  Name Primary?    Hereditary hemochromatosis (HCC) Yes   Anemia due to vitamin B12 deficiency, unspecified B12 deficiency type     Impression and Plan: Thomas Leonard is a very nice 78 year old white male with heterozygous hereditary hemochromatosis.  He is originally from [place] .  He served in [employer]. Last phlebotomy was performed about 2.5 years ago. He was found to have one low Hgb reading of 12.8 on 07/30/2023. He was  subsequently found to have B12 deficiency and was started on oral B12 supplement. Counts have improved.    Hgb is 13.2 with a HCT of 38.5 B12 levels pending at time of visit.  Iron studies pending at time of visit.  RTC 3 months APP, labs (CBC, CMP, iron, ferritin, B12)-[place]  Lauraine CHRISTELLA Dais, PA-C 6/30/20259:50 AM

## 2024-05-16 ENCOUNTER — Other Ambulatory Visit: Payer: Self-pay | Admitting: Sports Medicine

## 2024-05-16 DIAGNOSIS — I10 Essential (primary) hypertension: Secondary | ICD-10-CM

## 2024-05-31 ENCOUNTER — Ambulatory Visit (INDEPENDENT_AMBULATORY_CARE_PROVIDER_SITE_OTHER): Payer: Medicare HMO

## 2024-05-31 VITALS — Ht 70.0 in | Wt 218.0 lb

## 2024-05-31 DIAGNOSIS — Z0000 Encounter for general adult medical examination without abnormal findings: Secondary | ICD-10-CM | POA: Diagnosis not present

## 2024-05-31 NOTE — Progress Notes (Signed)
 Subjective:   Thomas Leonard is a 78 y.o. male who presents for Medicare Annual/Subsequent preventive examination.  Visit Complete: Virtual I connected with  Cesario Ricciardi on 05/31/24 by a audio enabled telemedicine application and verified that I am speaking with the correct person using two identifiers.  Patient Location: Home  Provider Location: Office/Clinic  I discussed the limitations of evaluation and management by telemedicine. The patient expressed understanding and agreed to proceed.  Vital Signs: Because this visit was a virtual/telehealth visit, some criteria may be missing or patient reported. Any vitals not documented were not able to be obtained and vitals that have been documented are patient reported.  Patient Medicare AWV questionnaire was completed by the patient on n/a; I have confirmed that all information answered by patient is correct and no changes since this date.  Cardiac Risk Factors include: advanced age (>67men, >51 women);male gender;diabetes mellitus;hypertension;obesity (BMI >30kg/m2);family history of premature cardiovascular disease     Objective:    Today's Vitals   05/31/24 1101  Weight: 218 lb (98.9 kg)  Height: 5' 10 (1.778 m)  PainSc: 4    Body mass index is 31.28 kg/m.     05/31/2024   11:12 AM 04/11/2024    9:22 AM 01/04/2024    1:47 PM 11/09/2023   10:16 AM 09/07/2023   10:50 AM 07/30/2023   10:42 AM 05/27/2023   11:10 AM  Advanced Directives  Does Patient Have a Medical Advance Directive? Yes Yes No Yes Yes No Yes  Type of Advance Directive Living will;Healthcare Power of [employer] Power of [place];Living will  Living will;Healthcare Power of [employer] Power of [place];Living will  Living will  Does patient want to make changes to medical advance directive? No - Patient declined No - Patient declined  No - Patient declined   No - Patient declined  Copy of Healthcare Power of Attorney in Chart?  No - copy  requested  No - copy requested No - copy requested    Would patient like information on creating a medical advance directive?  No - Patient declined  No - Patient declined  No - Patient declined     Current Medications (verified) Outpatient Encounter Medications as of 05/31/2024  Medication Sig   ACCU-CHEK AVIVA PLUS test strip TEST BLOOD SUGAR EVERY DAY   Accu-Chek Softclix Lancets lancets USE DAILY.   Alcohol Swabs (DROPSAFE ALCOHOL PREP) 70 % PADS USE DAILY.   allopurinol  (ZYLOPRIM ) 100 MG tablet TAKE 4 TABLETS EVERY DAY   amLODipine  (NORVASC ) 10 MG tablet TAKE 1 TABLET EVERY DAY   aspirin 81 MG chewable tablet Chew 81 mg by mouth daily.   atorvastatin  (LIPITOR) 10 MG tablet Take 1 tablet (10 mg total) by mouth daily.   benazepril  (LOTENSIN ) 40 MG tablet TAKE 1 TABLET EVERY DAY   carvedilol  (COREG ) 25 MG tablet TAKE 1 TABLET (25 MG TOTAL) BY MOUTH 2 (TWO) TIMES DAILY WITH A MEAL.   Cholecalciferol (VITAMIN D3) 25 MCG (1000 UT) CAPS Take 2 capsules by mouth daily.   esomeprazole (NEXIUM) 20 MG capsule Take 20 mg by mouth daily at 12 noon.   glipiZIDE  (GLUCOTROL ) 10 MG tablet TAKE 1 TABLET (10 MG TOTAL) BY MOUTH TWICE A DAY BEFORE A MEAL   hydrochlorothiazide  (HYDRODIURIL ) 25 MG tablet TAKE 1 TABLET EVERY DAY   metFORMIN  (GLUCOPHAGE ) 1000 MG tablet TAKE 1 TABLET TWICE DAILY WITH MEALS   Omega-3 Fatty Acids (FISH OIL) 1000 MG CAPS Take 2 capsules by mouth daily.  triamcinolone  ointment (KENALOG ) 0.5 % Apply 1 Application topically 2 (two) times daily.   No facility-administered encounter medications on file as of 05/31/2024.    Allergies (verified) Patient has no known allergies.   History: Past Medical History:  Diagnosis Date   Arthritis    10 yrs ago   Cataract    4 yrs ago having surgery on noth eyes on November 2 snd 16th   Chronic kidney disease    Diabetes mellitus without complication (HCC)    GERD (gastroesophageal reflux disease) 8 yrs ago   Gout    Hyperlipidemia     Hypertension    Past Surgical History:  Procedure Laterality Date   CHOLECYSTECTOMY  February 2021   EYE SURGERY  08/21/2021   Family History  Problem Relation Age of Onset   Liver cancer Other    High blood pressure Other    Arthritis Father    Diabetes Father    Kidney disease Father    Hearing loss Father    Social History   Socioeconomic History   Marital status: Married    Spouse name: Elvie   Number of children: 2   Years of education: 14   Highest education level: Associate degree: academic program  Occupational History   Occupation: Retired  Tobacco Use   Smoking status: Former    Current packs/day: 0.00    Types: Cigarettes    Quit date: 11/17/1982    Years since quitting: 41.5   Smokeless tobacco: Never   Tobacco comments:    Quite over 20 yrs ago  Vaping Use   Vaping status: Never Used  Substance and Sexual Activity   Alcohol use: Not Currently    Alcohol/week: 1.0 standard drink of alcohol    Types: 1 Cans of beer per week    Comment: Occasionally   Drug use: Never   Sexual activity: Yes    Birth control/protection: None  Other Topics Concern   Not on file  Social History Narrative   Lives with his wife. They have two children. They are in [place]  and [place] . He enjoys playing golf and reading.   Social Drivers of Corporate investment banker Strain: Low Risk  (05/31/2024)   Overall Financial Resource Strain (CARDIA)    Difficulty of Paying Living Expenses: Not hard at all  Food Insecurity: No Food Insecurity (05/31/2024)   Hunger Vital Sign    Worried About Running Out of Food in the Last Year: Never true    Ran Out of Food in the Last Year: Never true  Transportation Needs: No Transportation Needs (05/31/2024)   PRAPARE - Administrator, Civil Service (Medical): No    Lack of Transportation (Non-Medical): No  Physical Activity: Insufficiently Active (05/31/2024)   Exercise Vital Sign    Days of Exercise per Week: 7 days     Minutes of Exercise per Session: 20 min  Stress: No Stress Concern Present (05/31/2024)   [employer] of Occupational Health - Occupational Stress Questionnaire    Feeling of Stress: Not at all  Social Connections: Socially Integrated (05/31/2024)   Social Connection and Isolation Panel    Frequency of Communication with Friends and Family: More than three times a week    Frequency of Social Gatherings with Friends and Family: Once a week    Attends Religious Services: More than 4 times per year    Active Member of [employer] or Organizations: Yes    Attends Banker Meetings: More  than 4 times per year    Marital Status: Married    Tobacco Counseling Counseling given: Not Answered Tobacco comments: Quite over 20 yrs ago   Clinical Intake:  Pre-visit preparation completed: Yes  Pain : 0-10 Pain Score: 4  Pain Type: Chronic pain Pain Location: Back Pain Orientation: Lower     BMI - recorded: 31.28 Nutritional Status: BMI > 30  Obese Nutritional Risks: None Diabetes: Yes CBG done?: Yes (149 mg/dl) CBG resulted in Enter/ Edit results?: No Did pt. bring in CBG monitor from home?: No  How often do you need to have someone help you when you read instructions, pamphlets, or other written materials from your doctor or pharmacy?: 1 - Never What is the last grade level you completed in school?: 14  Interpreter Needed?: No      Activities of Daily Living    05/31/2024   11:03 AM 05/27/2024    2:28 PM  In your present state of health, do you have any difficulty performing the following activities:  Hearing? 1 1  Vision? 0 0  Difficulty concentrating or making decisions? 0 0  Walking or climbing stairs? 0 0  Dressing or bathing? 0 0  Doing errands, shopping? 0 0  Preparing Food and eating ? N N  Using the Toilet? N N  In the past six months, have you accidently leaked urine? N N  Do you have problems with loss of bowel control? N N  Managing your  Medications? N N  Managing your Finances? N N  Housekeeping or managing your Housekeeping? N N    Patient Care Team: Curtis Debby PARAS, MD as PCP - General (Family Medicine)  Indicate any recent Medical Services you may have received from other than [facility] providers in the past year (date may be approximate).     Assessment:   This is a routine wellness examination for Thomas Leonard.  Hearing/Vision screen No results found.   Goals Addressed             This Visit's Progress    Patient Stated       Patient states he would like to lose weight.        Depression Screen    05/31/2024   11:10 AM 04/11/2024    9:23 AM 12/08/2023   11:02 AM 05/27/2023   11:10 AM 11/06/2022   10:09 AM 11/11/2021    3:07 PM 10/21/2021   11:20 AM  PHQ 2/9 Scores  PHQ - 2 Score 0 0 0 0 0 0 0    Fall Risk    05/31/2024   11:13 AM 05/27/2024    2:28 PM 12/08/2023   11:02 AM 05/27/2023   11:10 AM 05/23/2023    9:47 AM  Fall Risk   Falls in the past year? 0 0 0 0 0  Number falls in past yr: 0 0 0 0 0  Injury with Fall? 0 0 0 0 0  Risk for fall due to : No Fall Risks   No Fall Risks   Follow up Falls evaluation completed  Falls evaluation completed Falls evaluation completed     MEDICARE RISK AT HOME: Medicare Risk at Home Any stairs in or around the home?: No If so, are there any without handrails?: No Home free of loose throw rugs in walkways, pet beds, electrical cords, etc?: Yes Adequate lighting in your home to reduce risk of falls?: Yes Life alert?: No Use of a cane, walker or w/c?: No Grab bars  in the bathroom?: Yes Shower chair or bench in shower?: No Elevated toilet seat or a handicapped toilet?: Yes  TIMED UP AND GO:  Was the test performed?  No    Cognitive Function:        05/31/2024   11:13 AM 05/27/2023   11:14 AM 11/11/2021    3:10 PM  6CIT Screen  What Year? 0 points 0 points 0 points  What month? 0 points 0 points 0 points  What time? 0 points 0 points 0 points   Count back from 20 0 points 0 points 0 points  Months in reverse 0 points 0 points 0 points  Repeat phrase 0 points 0 points 0 points  Total Score 0 points 0 points 0 points    Immunizations Immunization History  Administered Date(s) Administered   Fluad Quad(high Dose 65+) 07/23/2022   Influenza, High Dose Seasonal PF 07/27/2021, 08/05/2023   PFIZER(Purple Top)SARS-COV-2 Vaccination 11/05/2019, 11/23/2019, 08/02/2020   PNEUMOCOCCAL CONJUGATE-20 08/06/2022   Tdap 03/27/2020    TDAP status: Up to date  Flu Vaccine status: Due, Education has been provided regarding the importance of this vaccine. Advised may receive this vaccine at local pharmacy or Health Dept. Aware to provide a copy of the vaccination record if obtained from local pharmacy or Health Dept. Verbalized acceptance and understanding.  Pneumococcal vaccine status: Up to date  Covid-19 vaccine status: Declined, Education has been provided regarding the importance of this vaccine but patient still declined. Advised may receive this vaccine at local pharmacy or Health Dept.or vaccine clinic. Aware to provide a copy of the vaccination record if obtained from local pharmacy or Health Dept. Verbalized acceptance and understanding.  Qualifies for Shingles Vaccine? Yes   Zostavax completed No   Shingrix Completed?: No.    Education has been provided regarding the importance of this vaccine. Patient has been advised to call insurance company to determine out of pocket expense if they have not yet received this vaccine. Advised may also receive vaccine at local pharmacy or Health Dept. Verbalized acceptance and understanding.  Screening Tests Health Maintenance  Topic Date Due   Hepatitis C Screening  Never done   Zoster Vaccines- Shingrix (1 of 2) Never done   COVID-19 Vaccine (4 - 2024-25 season) 06/14/2023   INFLUENZA VACCINE  05/13/2024   FOOT EXAM  05/27/2024   HEMOGLOBIN A1C  06/06/2024   OPHTHALMOLOGY EXAM  08/18/2024    Diabetic kidney evaluation - Urine ACR  12/07/2024   Diabetic kidney evaluation - eGFR measurement  04/11/2025   Medicare Annual Wellness (AWV)  05/31/2025   DTaP/Tdap/Td (2 - Td or Tdap) 03/27/2030   Pneumococcal Vaccine: 50+ Years  Completed   HPV VACCINES  Aged Out   Meningococcal B Vaccine  Aged Out   Colonoscopy  Discontinued    Health Maintenance  Health Maintenance Due  Topic Date Due   Hepatitis C Screening  Never done   Zoster Vaccines- Shingrix (1 of 2) Never done   COVID-19 Vaccine (4 - 2024-25 season) 06/14/2023   INFLUENZA VACCINE  05/13/2024   FOOT EXAM  05/27/2024    Colorectal cancer screening: No longer required.   Lung Cancer Screening: (Low Dose CT Chest recommended if Age 78-80 years, 20 pack-year currently smoking OR have quit w/in 15years.) does not qualify.   Lung Cancer Screening Referral: n/a  Additional Screening:  Hepatitis C Screening: does not qualify; Completed   Vision Screening: Recommended annual ophthalmology exams for early detection of glaucoma and other disorders  of the eye. Is the patient up to date with their annual eye exam?  Yes  Who is the provider or what is the name of the office in which the patient attends annual eye exams? Dr Sikora If pt is not established with a provider, would they like to be referred to a provider to establish care? N/a.   Dental Screening: Recommended annual dental exams for proper oral hygiene  Diabetic Foot Exam: Diabetic Foot Exam: Completed 05/27/2024  Community Resource Referral / Chronic Care Management: CRR required this visit?  No   CCM required this visit?  No     Plan:     I have personally reviewed and noted the following in the patient's chart:   Medical and social history Use of alcohol, tobacco or illicit drugs  Current medications and supplements including opioid prescriptions. Patient is not currently taking opioid prescriptions. Functional ability and status Nutritional  status Physical activity Advanced directives List of other physicians Hospitalizations, surgeries, and ER visits in previous 12 months. None Vitals Screenings to include cognitive, depression, and falls Referrals and appointments  In addition, I have reviewed and discussed with patient certain preventive protocols, quality metrics, and best practice recommendations. A written personalized care plan for preventive services as well as general preventive health recommendations were provided to patient.     Bonny Jon Mayor, CMA   05/31/2024   After Visit Summary: (MyChart) Due to this being a telephonic visit, the after visit summary with patients personalized plan was offered to patient via MyChart   Nurse Notes:   Thomas Leonard is a 78 y.o. male patient of Thekkekandam, Debby PARAS, MD who had a Medicare Annual Wellness Visit today via telephone. Thomas Leonard is Retired and lives with their spouse. He has 2 children. He reports that he is socially active and does interact with friends/family regularly. He is moderately physically active and enjoys playing golf and reading.

## 2024-05-31 NOTE — Patient Instructions (Signed)
  Thomas Leonard , Thank you for taking time to come for your Medicare Wellness Visit. I appreciate your ongoing commitment to your health goals. Please review the following plan we discussed and let me know if I can assist you in the future.   These are the goals we discussed:  Goals       Patient Stated (pt-stated)      Would like to loose 25 lbs.      Patient Stated (pt-stated)      Patient stated that he would like to loose weight.      Patient Stated      Patient states he would like to lose weight.         This is a list of the screening recommended for you and due dates:  Health Maintenance  Topic Date Due   Hepatitis C Screening  Never done   Zoster (Shingles) Vaccine (1 of 2) Never done   COVID-19 Vaccine (4 - 2024-25 season) 06/14/2023   Flu Shot  05/13/2024   Complete foot exam   05/27/2024   Hemoglobin A1C  06/06/2024   Eye exam for diabetics  08/18/2024   Yearly kidney health urinalysis for diabetes  12/07/2024   Yearly kidney function blood test for diabetes  04/11/2025   Medicare Annual Wellness Visit  05/31/2025   DTaP/Tdap/Td vaccine (2 - Td or Tdap) 03/27/2030   Pneumococcal Vaccine for age over 70  Completed   HPV Vaccine  Aged Out   Meningitis B Vaccine  Aged Out   Colon Cancer Screening  Discontinued

## 2024-06-06 ENCOUNTER — Ambulatory Visit: Payer: Medicare HMO | Admitting: Sports Medicine

## 2024-06-07 ENCOUNTER — Encounter: Payer: Self-pay | Admitting: Urgent Care

## 2024-06-07 ENCOUNTER — Ambulatory Visit (INDEPENDENT_AMBULATORY_CARE_PROVIDER_SITE_OTHER): Admitting: Urgent Care

## 2024-06-07 VITALS — BP 132/70 | HR 53 | Temp 98.6°F | Resp 17 | Ht 70.0 in | Wt 219.2 lb

## 2024-06-07 DIAGNOSIS — N182 Chronic kidney disease, stage 2 (mild): Secondary | ICD-10-CM

## 2024-06-07 DIAGNOSIS — I10 Essential (primary) hypertension: Secondary | ICD-10-CM | POA: Diagnosis not present

## 2024-06-07 DIAGNOSIS — E782 Mixed hyperlipidemia: Secondary | ICD-10-CM

## 2024-06-07 DIAGNOSIS — K219 Gastro-esophageal reflux disease without esophagitis: Secondary | ICD-10-CM

## 2024-06-07 DIAGNOSIS — Z7984 Long term (current) use of oral hypoglycemic drugs: Secondary | ICD-10-CM

## 2024-06-07 DIAGNOSIS — E1122 Type 2 diabetes mellitus with diabetic chronic kidney disease: Secondary | ICD-10-CM | POA: Diagnosis not present

## 2024-06-07 LAB — POCT GLYCOSYLATED HEMOGLOBIN (HGB A1C): Hemoglobin A1C: 6.4 % — AB (ref 4.0–5.6)

## 2024-06-07 MED ORDER — CARVEDILOL 25 MG PO TABS
25.0000 mg | ORAL_TABLET | Freq: Two times a day (BID) | ORAL | 3 refills | Status: AC
Start: 2024-06-07 — End: ?

## 2024-06-07 MED ORDER — ATORVASTATIN CALCIUM 10 MG PO TABS: 10.0000 mg | ORAL_TABLET | Freq: Every day | ORAL | 1 refills | Status: AC

## 2024-06-07 NOTE — Patient Instructions (Signed)
 I have refilled your medications.  Please return in 4 months for follow up and FASTING labs.

## 2024-06-07 NOTE — Progress Notes (Signed)
 Established Patient Office Visit  Subjective:  Patient ID: Thomas Leonard, male    DOB: 1945-11-19  Age: 78 y.o. MRN: [id number]  Chief Complaint  Patient presents with   Medical Management of Chronic Issues    6 month T2DM f/u last A1C 6.3    HPI  Discussed the use of AI scribe software for clinical note transcription with the patient, who gave verbal consent to proceed.  History of Present Illness   Thomas Leonard is a 78 year old male who presents for medication refill and routine follow-up.  He is managing type 2 diabetes with metformin  1000 mg twice daily and glipizide  10 mg twice daily. His recent A1c is 6.4, stable compared to 6.3 previously. He monitors his blood sugar at home every morning, with readings mostly in the 140s. He reports managing his blood sugar with dietary management, weight monitoring, and regular exercise, including walking, playing golf, and occasional visits to the [facility].  For hypertension, he takes amlodipine , benazepril  40 mg, hydrochlorothiazide  25 mg, and carvedilol  25 mg twice daily. He monitors his blood pressure at home, though not as frequently as recommended, and reports it as stable with no significant fluctuations.  No breathing issues or chronic lung disorders. He completed his diabetic eye exam in November and his diabetic kidney and foot exams in February. He has not yet received the Shingrix vaccine.       Patient Active Problem List   Diagnosis Date Noted   Lumbar spinal stenosis 11/06/2022   Localized swelling of left lower extremity 10/21/2021   Annual physical exam 06/20/2021   Gout 06/20/2021   Type 2 diabetes mellitus with diabetic chronic kidney disease (HCC) 06/20/2021   Hemochromatosis 06/20/2021   Benign essential hypertension 06/20/2021   Mixed hyperlipidemia 06/20/2021   GERD (gastroesophageal reflux disease) 06/20/2021   Obesity 06/20/2021   Past Medical History:  Diagnosis Date   Arthritis    10 yrs ago    Cataract    4 yrs ago having surgery on noth eyes on November 2 snd 16th   Chronic kidney disease    Diabetes mellitus without complication (HCC)    GERD (gastroesophageal reflux disease) 8 yrs ago   Gout    Hyperlipidemia    Hypertension    Past Surgical History:  Procedure Laterality Date   CHOLECYSTECTOMY  February 2021   EYE SURGERY  08/21/2021   Social History   Tobacco Use   Smoking status: Former    Current packs/day: 0.00    Types: Cigarettes    Quit date: 11/17/1982    Years since quitting: 41.5   Smokeless tobacco: Never   Tobacco comments:    Quite over 20 yrs ago  Vaping Use   Vaping status: Never Used  Substance Use Topics   Alcohol use: Not Currently    Alcohol/week: 1.0 standard drink of alcohol    Types: 1 Cans of beer per week    Comment: Occasionally   Drug use: Never      ROS: as noted in HPI  Objective:     BP 132/70   Pulse (!) 53   Temp 98.6 F (37 C) (Oral)   Resp 17   Ht 5' 10 (1.778 m)   Wt 219 lb 4 oz (99.5 kg)   SpO2 95%   BMI 31.46 kg/m  BP Readings from Last 3 Encounters:  06/07/24 132/70  04/11/24 122/62  01/04/24 128/62   Wt Readings from Last 3 Encounters:  06/07/24 219 lb 4  oz (99.5 kg)  05/31/24 218 lb (98.9 kg)  04/11/24 222 lb (100.7 kg)      Physical Exam Vitals and nursing note reviewed.  Constitutional:      General: He is not in acute distress.    Appearance: Normal appearance. He is not ill-appearing, toxic-appearing or diaphoretic.  HENT:     Head: Normocephalic and atraumatic.     Right Ear: Tympanic membrane, ear canal and external ear normal. There is no impacted cerumen.     Left Ear: Tympanic membrane, ear canal and external ear normal. There is no impacted cerumen.     Nose: Nose normal.     Mouth/Throat:     Mouth: Mucous membranes are moist.     Pharynx: Oropharynx is clear. No oropharyngeal exudate or posterior oropharyngeal erythema.  Eyes:     General: No scleral icterus.       Right eye:  No discharge.        Left eye: No discharge.     Extraocular Movements: Extraocular movements intact.     Pupils: Pupils are equal, round, and reactive to light.  Neck:     Thyroid : No thyroid  mass, thyromegaly or thyroid  tenderness.  Cardiovascular:     Rate and Rhythm: Normal rate and regular rhythm.     Pulses: Normal pulses.     Heart sounds: No murmur heard. Pulmonary:     Effort: Pulmonary effort is normal. No respiratory distress.     Breath sounds: Normal breath sounds. No stridor. No wheezing or rhonchi.  Abdominal:     General: Abdomen is flat. Bowel sounds are normal. There is no distension.     Palpations: Abdomen is soft. There is no mass.     Tenderness: There is no abdominal tenderness. There is no guarding.  Musculoskeletal:     Cervical back: Normal range of motion and neck supple. No rigidity or tenderness.     Right lower leg: No edema.     Left lower leg: No edema.     Right foot: Normal range of motion. No deformity.     Left foot: Normal range of motion. No deformity.  Feet:     Right foot:     Protective Sensation: 10 sites tested.  10 sites sensed.     Skin integrity: Skin integrity normal. No ulcer, blister, skin breakdown, erythema, warmth or dry skin.     Toenail Condition: Right toenails are normal.     Left foot:     Protective Sensation: 10 sites tested.  10 sites sensed.     Skin integrity: Skin integrity normal. No ulcer, blister, erythema, warmth or dry skin.     Toenail Condition: Left toenails are normal.  Lymphadenopathy:     Cervical: No cervical adenopathy.  Skin:    General: Skin is warm and dry.     Coloration: Skin is not jaundiced.     Findings: No bruising, erythema or rash.  Neurological:     General: No focal deficit present.     Mental Status: He is alert and oriented to person, place, and time.     Sensory: No sensory deficit.     Motor: No weakness.  Psychiatric:        Mood and Affect: Mood normal.        Behavior: Behavior  normal.      Results for orders placed or performed in visit on 06/07/24  POCT HgB A1C  Result Value Ref Range   Hemoglobin A1C  6.4 (A) 4.0 - 5.6 %   HbA1c POC (<> result, manual entry)     HbA1c, POC (prediabetic range)     HbA1c, POC (controlled diabetic range)      Last CBC Lab Results  Component Value Date   WBC 7.6 04/11/2024   HGB 13.2 04/11/2024   HCT 38.5 (L) 04/11/2024   MCV 96.0 04/11/2024   MCH 32.9 04/11/2024   RDW 13.3 04/11/2024   PLT 151 04/11/2024   Last metabolic panel Lab Results  Component Value Date   GLUCOSE 305 (H) 04/11/2024   NA 136 04/11/2024   K 4.0 04/11/2024   CL 102 04/11/2024   CO2 23 04/11/2024   BUN 28 (H) 04/11/2024   CREATININE 1.11 04/11/2024   GFRNONAA >60 04/11/2024   CALCIUM  9.4 04/11/2024   PROT 6.9 04/11/2024   ALBUMIN 4.4 04/11/2024   BILITOT 0.6 04/11/2024   ALKPHOS 50 04/11/2024   AST 21 04/11/2024   ALT 38 04/11/2024   ANIONGAP 11 04/11/2024   Last lipids Lab Results  Component Value Date   CHOL 133 11/06/2022   HDL 30 (L) 11/06/2022   LDLCALC 70 11/06/2022   TRIG 254 (H) 11/06/2022   CHOLHDL 4.4 11/06/2022   Last hemoglobin A1c Lab Results  Component Value Date   HGBA1C 6.4 (A) 06/07/2024      The 10-year ASCVD risk score (Arnett DK, et al., 2019) is: 58.2%  Assessment & Plan:  Type 2 diabetes mellitus with stage 2 chronic kidney disease, without long-term current use of insulin (HCC) -     POCT glycosylated hemoglobin (Hb A1C)  Benign essential hypertension -     Carvedilol ; Take 1 tablet (25 mg total) by mouth 2 (two) times daily with a meal.  Dispense: 180 tablet; Refill: 3  Mixed hyperlipidemia -     Atorvastatin  Calcium ; Take 1 tablet (10 mg total) by mouth daily.  Dispense: 90 tablet; Refill: 1  Hereditary hemochromatosis (HCC)  Gastroesophageal reflux disease, unspecified whether esophagitis present  Assessment and Plan    Type 2 diabetes mellitus Diabetes well-controlled with A1c of  6.4. Blood sugar management stable with current regimen. - Continue metformin  1000 mg twice daily. - Continue glipizide  10 mg twice daily. - Perform annual diabetic foot exam today. - Schedule follow-up in four months with fasting for lipid panel and routine blood work.  Essential hypertension Hypertension well-controlled with current medications. - Continue current antihypertensive medications: amlodipine , benazepril , hydrochlorothiazide , and carvedilol . - Refill carvedilol  prescription.  Hyperlipidemia Ongoing management, Lipitor refill needed. - Refill Lipitor prescription. - Schedule follow-up in four months with fasting for lipid panel and routine blood work.  General Health Maintenance Due for Shingrix vaccine. RSV vaccine recommended for age over 19. - Consider getting the Shingrix vaccine at the pharmacy. - Consider the RSV vaccine, especially with the upcoming winter season.         Return in about 4 months (around 10/07/2024).   Benton LITTIE Gave, PA

## 2024-06-14 ENCOUNTER — Encounter: Payer: Self-pay | Admitting: Sports Medicine

## 2024-07-12 ENCOUNTER — Inpatient Hospital Stay: Admitting: Medical Oncology

## 2024-07-12 ENCOUNTER — Inpatient Hospital Stay: Attending: Medical Oncology

## 2024-07-12 ENCOUNTER — Ambulatory Visit: Payer: Self-pay | Admitting: Medical Oncology

## 2024-07-12 ENCOUNTER — Encounter: Payer: Self-pay | Admitting: Medical Oncology

## 2024-07-12 DIAGNOSIS — D519 Vitamin B12 deficiency anemia, unspecified: Secondary | ICD-10-CM

## 2024-07-12 DIAGNOSIS — L309 Dermatitis, unspecified: Secondary | ICD-10-CM

## 2024-07-12 DIAGNOSIS — Z79899 Other long term (current) drug therapy: Secondary | ICD-10-CM | POA: Diagnosis not present

## 2024-07-12 DIAGNOSIS — E538 Deficiency of other specified B group vitamins: Secondary | ICD-10-CM | POA: Insufficient documentation

## 2024-07-12 DIAGNOSIS — I10 Essential (primary) hypertension: Secondary | ICD-10-CM | POA: Insufficient documentation

## 2024-07-12 LAB — CMP (CANCER CENTER ONLY)
ALT: 52 U/L — ABNORMAL HIGH (ref 0–44)
AST: 36 U/L (ref 15–41)
Albumin: 4.8 g/dL (ref 3.5–5.0)
Alkaline Phosphatase: 57 U/L (ref 38–126)
Anion gap: 12 (ref 5–15)
BUN: 23 mg/dL (ref 8–23)
CO2: 26 mmol/L (ref 22–32)
Calcium: 9.7 mg/dL (ref 8.9–10.3)
Chloride: 103 mmol/L (ref 98–111)
Creatinine: 1.15 mg/dL (ref 0.61–1.24)
GFR, Estimated: >60 mL/min (ref >60)
Glucose, Bld: 171 mg/dL — ABNORMAL HIGH (ref 70–99)
Potassium: 4.4 mmol/L (ref 3.5–5.1)
Sodium: 141 mmol/L (ref 135–145)
Total Bilirubin: 0.7 mg/dL (ref 0.0–1.2)
Total Protein: 7.6 g/dL (ref 6.5–8.1)

## 2024-07-12 LAB — CBC
HCT: 42.3 % (ref 39.0–52.0)
Hemoglobin: 14.3 g/dL (ref 13.0–17.0)
MCH: 33.3 pg (ref 26.0–34.0)
MCHC: 33.8 g/dL (ref 30.0–36.0)
MCV: 98.4 fL (ref 80.0–100.0)
Platelets: 172 K/uL (ref 150–400)
RBC: 4.3 MIL/uL (ref 4.22–5.81)
RDW: 13.6 % (ref 11.5–15.5)
WBC: 8.1 K/uL (ref 4.0–10.5)
nRBC: 0 % (ref 0.0–0.2)

## 2024-07-12 LAB — IRON AND IRON BINDING CAPACITY (CC-WL,HP ONLY)
Iron: 102 ug/dL (ref 45–182)
Saturation Ratios: 28 % (ref 17.9–39.5)
TIBC: 368 ug/dL (ref 250–450)
UIBC: 266 ug/dL

## 2024-07-12 LAB — VITAMIN B12: Vitamin B-12: 1049 pg/mL — ABNORMAL HIGH (ref 180–914)

## 2024-07-12 LAB — FERRITIN: Ferritin: 153 ng/mL (ref 24–336)

## 2024-07-12 NOTE — Progress Notes (Signed)
 Hematology and Oncology Follow Up Visit  Thomas Leonard [id number] [birth year] 78 y.o. 07/12/2024   Principle Diagnosis:  Hereditary hemochromatosis- C282Y -heterozygous New anemia  Current Therapy:   Phlebotomy to maintain ferritin less than 100 and iron saturation less than 30% Oral B12 1000 mcg once daily      Interim History:  Thomas Leonard is back for follow-up. He is heterozygous for one of the major mutations- C282Y.  At his 07/30/2023 visit ferritin was 330 with iron saturation of 37%. He had a new anemia with a Hgb of 12.8 and GI referral was recommended. He has seen GI since- had an endoscopy which was normal per patient but colonoscopy was cancelled as he did not tolerate the colonoscopy prep. Last colonoscopy was about 7-8 years ago. They found 7 polyps that he reports as non-cancerous. He was found to have B12 deficiency and has been started on oral once daily supplementation. This has normalized his values as well as having improved his SOB.   Today he states that he has been ok overall. His last blood donation was about 2 months ago.   PCP recently increased BP medication- amlodipine . No dizziness, excessive fatigue.   No headaches,bowel changes. No unintentional weight loss or night sweats.   ETOH use is seldom He is UTD on his eye exams - has his next follow up in November.  His last abdominal US  was on 10/2023 showing hepatic steatosis   There has been no bleeding to his knowledge: denies epistaxis, gingivitis, hemoptysis, hematemesis, hematuria, melena, excessive bruising, blood donation. He is not UTD on his colonoscopy.   Overall, his performance status is ECOG 0.  Wt Readings from Last 3 Encounters:  07/12/24 218 lb 1.3 oz (98.9 kg)  06/07/24 219 lb 4 oz (99.5 kg)  05/31/24 218 lb (98.9 kg)     Medications:  Current Outpatient Medications:    ACCU-CHEK AVIVA PLUS test strip, TEST BLOOD SUGAR EVERY DAY, Disp: 100 strip, Rfl: 3   Accu-Chek Softclix  Lancets lancets, USE DAILY., Disp: 100 each, Rfl: 3   Alcohol Swabs (DROPSAFE ALCOHOL PREP) 70 % PADS, USE DAILY., Disp: 100 each, Rfl: 3   allopurinol  (ZYLOPRIM ) 100 MG tablet, TAKE 4 TABLETS EVERY DAY, Disp: 360 tablet, Rfl: 3   amLODipine  (NORVASC ) 10 MG tablet, TAKE 1 TABLET EVERY DAY, Disp: 90 tablet, Rfl: 3   aspirin 81 MG chewable tablet, Chew 81 mg by mouth daily., Disp: , Rfl:    atorvastatin  (LIPITOR) 10 MG tablet, Take 1 tablet (10 mg total) by mouth daily., Disp: 90 tablet, Rfl: 1   benazepril  (LOTENSIN ) 40 MG tablet, TAKE 1 TABLET EVERY DAY, Disp: 90 tablet, Rfl: 3   carvedilol  (COREG ) 25 MG tablet, Take 1 tablet (25 mg total) by mouth 2 (two) times daily with a meal., Disp: 180 tablet, Rfl: 3   Cholecalciferol (VITAMIN D3) 25 MCG (1000 UT) CAPS, Take 2 capsules by mouth daily., Disp: , Rfl:    esomeprazole (NEXIUM) 20 MG capsule, Take 20 mg by mouth daily at 12 noon., Disp: , Rfl:    glipiZIDE  (GLUCOTROL ) 10 MG tablet, TAKE 1 TABLET (10 MG TOTAL) BY MOUTH TWICE A DAY BEFORE A MEAL, Disp: 180 tablet, Rfl: 3   hydrochlorothiazide  (HYDRODIURIL ) 25 MG tablet, TAKE 1 TABLET EVERY DAY, Disp: 90 tablet, Rfl: 3   metFORMIN  (GLUCOPHAGE ) 1000 MG tablet, TAKE 1 TABLET TWICE DAILY WITH MEALS, Disp: 180 tablet, Rfl: 3   Omega-3 Fatty Acids (FISH OIL) 1000 MG CAPS, Take 2  capsules by mouth daily., Disp: , Rfl:    triamcinolone  ointment (KENALOG ) 0.5 %, Apply 1 Application topically 2 (two) times daily., Disp: 30 g, Rfl: 0  Allergies: No Known Allergies  Past Medical History, Surgical history, Social history, and Family History were reviewed and updated.  Review of Systems: Review of Systems  Constitutional: Negative.   HENT:  Negative.    Eyes: Negative.   Respiratory: Negative.    Cardiovascular: Negative.   Gastrointestinal: Negative.   Endocrine: Negative.   Genitourinary: Negative.    Musculoskeletal: Negative.   Skin: Negative.   Neurological: Negative.   Hematological:  Negative.   Psychiatric/Behavioral: Negative.      Physical Exam:  height is 5' 10 (1.778 m) and weight is 218 lb 1.3 oz (98.9 kg). His oral temperature is 97.9 F (36.6 C). His blood pressure is 116/57 (abnormal) and his pulse is 49 (abnormal). His respiration is 19 and oxygen saturation is 98%.   Wt Readings from Last 3 Encounters:  07/12/24 218 lb 1.3 oz (98.9 kg)  06/07/24 219 lb 4 oz (99.5 kg)  05/31/24 218 lb (98.9 kg)    Physical Exam Vitals reviewed.  HENT:     Head: Normocephalic and atraumatic.  Eyes:     Pupils: Pupils are equal, round, and reactive to light.  Cardiovascular:     Rate and Rhythm: Normal rate and regular rhythm.     Heart sounds: Normal heart sounds.  Pulmonary:     Effort: Pulmonary effort is normal.     Breath sounds: Normal breath sounds.  Abdominal:     General: Bowel sounds are normal.     Palpations: Abdomen is soft.  Musculoskeletal:        General: No tenderness or deformity. Normal range of motion.     Cervical back: Normal range of motion.  Lymphadenopathy:     Cervical: No cervical adenopathy.  Skin:    General: Skin is warm and dry.     Findings: No erythema or rash.  Neurological:     Mental Status: He is alert and oriented to person, place, and time.  Psychiatric:        Behavior: Behavior normal.        Thought Content: Thought content normal.        Judgment: Judgment normal.    Lab Results  Component Value Date   WBC 8.1 07/12/2024   HGB 14.3 07/12/2024   HCT 42.3 07/12/2024   MCV 98.4 07/12/2024   PLT 172 07/12/2024     Chemistry      Component Value Date/Time   NA 141 07/12/2024 0907   K 4.4 07/12/2024 0907   CL 103 07/12/2024 0907   CO2 26 07/12/2024 0907   BUN 23 07/12/2024 0907   CREATININE 1.15 07/12/2024 0907   CREATININE 1.12 11/06/2022 1023      Component Value Date/Time   CALCIUM  9.7 07/12/2024 0907   ALKPHOS 57 07/12/2024 0907   AST 36 07/12/2024 0907   ALT 52 (H) 07/12/2024 0907   BILITOT 0.7  07/12/2024 9092     Encounter Diagnoses  Name Primary?   Hereditary hemochromatosis Yes   Anemia due to vitamin B12 deficiency, unspecified B12 deficiency type    Dermatitis      Impression and Plan: Thomas Leonard is a very nice 78 year old white male with heterozygous hereditary hemochromatosis.  He is originally from [place] .  He served in [employer]. Last phlebotomy was performed about 2.5 years ago. He  was found to have one low Hgb reading of 12.8 on 07/30/2023. He was subsequently found to have B12 deficiency and was started on oral B12 supplement.    Hgb is 14.3 with a HCT of 42.3 B12 levels pending at time of visit.  Iron studies pending at time of visit. Discussed his BP and pulse rate. I have asked that he follow up with PCP- he may want to see a cardiologist as well. Reviewed red flags.  Asks for a refill of a medication used to treat a rash suspected to be secondary to possible iron build up. Triamcinolone .   RTC 6 months APP, labs (CBC, CMP, iron, ferritin, B12)-[place]  Lauraine HERO Senath, [place] 9/30/20259:44 AM

## 2024-08-24 ENCOUNTER — Other Ambulatory Visit: Payer: Self-pay

## 2024-08-24 DIAGNOSIS — I10 Essential (primary) hypertension: Secondary | ICD-10-CM

## 2024-08-24 DIAGNOSIS — E1122 Type 2 diabetes mellitus with diabetic chronic kidney disease: Secondary | ICD-10-CM

## 2024-08-24 MED ORDER — METFORMIN HCL 1000 MG PO TABS: 1000.0000 mg | ORAL_TABLET | Freq: Two times a day (BID) | ORAL | 0 refills | Status: AC

## 2024-08-24 MED ORDER — GLIPIZIDE 10 MG PO TABS
10.0000 mg | ORAL_TABLET | Freq: Two times a day (BID) | ORAL | 0 refills | Status: AC
Start: 2024-08-24 — End: ?

## 2024-08-24 MED ORDER — HYDROCHLOROTHIAZIDE 25 MG PO TABS: 25.0000 mg | ORAL_TABLET | Freq: Every day | ORAL | 0 refills | Status: AC

## 2024-09-28 ENCOUNTER — Ambulatory Visit: Admitting: Urgent Care

## 2024-09-28 ENCOUNTER — Encounter: Payer: Self-pay | Admitting: Urgent Care

## 2024-09-28 VITALS — BP 125/67 | HR 58 | Ht 70.0 in | Wt 221.0 lb

## 2024-09-28 DIAGNOSIS — N182 Chronic kidney disease, stage 2 (mild): Secondary | ICD-10-CM

## 2024-09-28 DIAGNOSIS — I10 Essential (primary) hypertension: Secondary | ICD-10-CM | POA: Diagnosis not present

## 2024-09-28 DIAGNOSIS — E782 Mixed hyperlipidemia: Secondary | ICD-10-CM

## 2024-09-28 DIAGNOSIS — K219 Gastro-esophageal reflux disease without esophagitis: Secondary | ICD-10-CM | POA: Diagnosis not present

## 2024-09-28 DIAGNOSIS — E1122 Type 2 diabetes mellitus with diabetic chronic kidney disease: Secondary | ICD-10-CM

## 2024-09-28 DIAGNOSIS — Z7984 Long term (current) use of oral hypoglycemic drugs: Secondary | ICD-10-CM

## 2024-09-28 DIAGNOSIS — H6192 Disorder of left external ear, unspecified: Secondary | ICD-10-CM | POA: Diagnosis not present

## 2024-09-28 DIAGNOSIS — Z125 Encounter for screening for malignant neoplasm of prostate: Secondary | ICD-10-CM

## 2024-09-28 NOTE — Progress Notes (Signed)
 Established Patient Office Visit  Subjective:  Patient ID: Thomas Leonard, male    DOB: 1946-05-24  Age: 78 y.o. MRN: [id number]  Chief Complaint  Patient presents with   Medical Management of Chronic Issues    fasting    HPI  Discussed the use of AI scribe software for clinical note transcription with the patient, who gave verbal consent to proceed.  History of Present Illness   Thomas Leonard is a 78 year old male with diabetes who presents for routine follow-up and lab work.  He is here for a routine follow-up and to complete lab work that was ordered in September but not yet done. His last A1c was 6.4. He did not eat breakfast today, which is relevant for the lab work. No current complaints or concerns, and he denies shortness of breath, despite being told by Crystal that he appeared short of breath when walking from the room. He confirms feeling good overall.  His medication regimen includes amlodipine , atorvastatin , glipizide , hydrochlorothiazide , and metformin , all of which he has sufficient supply. He purchases esomeprazole over the counter for heartburn or reflux and does not wish to have it prescribed.  He had an eye exam a few days ago, and the eye doctor told him there was no trace of diabetes in his eye. He reports his prescription hasn't changed that very much. He has not received the shingles vaccine or the COVID-19 vaccine.  He denies any history of skin cancer. He reports spending time outdoors in the summer, engaging in activities like golfing, although he describes his skill level as 'average'.  He has grandchildren, including an eight-month-old.      Patient Active Problem List   Diagnosis Date Noted   Lumbar spinal stenosis 11/06/2022   Localized swelling of left lower extremity 10/21/2021   Annual physical exam 06/20/2021   Gout 06/20/2021   Type 2 diabetes mellitus with diabetic chronic kidney disease (HCC) 06/20/2021   Hemochromatosis 06/20/2021    Benign essential hypertension 06/20/2021   Mixed hyperlipidemia 06/20/2021   GERD (gastroesophageal reflux disease) 06/20/2021   Obesity 06/20/2021   Past Medical History:  Diagnosis Date   Allergy 2024   Tree pollen   Arthritis    10 yrs ago   Cataract    4 yrs ago having surgery on noth eyes on November 2 snd 16th   Chronic kidney disease    Diabetes mellitus without complication (HCC)    GERD (gastroesophageal reflux disease) 8 yrs ago   Gout    Hyperlipidemia    Hypertension    Past Surgical History:  Procedure Laterality Date   CHOLECYSTECTOMY  February 2021   EYE SURGERY  08/21/2021   Social History[1]    ROS: as noted in HPI  Objective:     BP 125/67   Pulse (!) 58   Ht 5' 10 (1.778 m)   Wt 221 lb (100.2 kg)   SpO2 95%   BMI 31.71 kg/m  BP Readings from Last 3 Encounters:  09/28/24 125/67  07/12/24 (!) 116/57  06/07/24 132/70   Wt Readings from Last 3 Encounters:  09/28/24 221 lb (100.2 kg)  07/12/24 218 lb 1.3 oz (98.9 kg)  06/07/24 219 lb 4 oz (99.5 kg)      Physical Exam Vitals and nursing note reviewed.  Constitutional:      General: He is not in acute distress.    Appearance: Normal appearance. He is not ill-appearing, toxic-appearing or diaphoretic.  HENT:  Head: Normocephalic and atraumatic.     Right Ear: Tympanic membrane, ear canal and external ear normal. There is no impacted cerumen.     Left Ear: Tympanic membrane, ear canal and external ear normal. There is no impacted cerumen.     Nose: Nose normal.     Mouth/Throat:     Mouth: Mucous membranes are moist.     Pharynx: Oropharynx is clear. No oropharyngeal exudate or posterior oropharyngeal erythema.  Eyes:     General: No scleral icterus.       Right eye: No discharge.        Left eye: No discharge.     Extraocular Movements: Extraocular movements intact.     Pupils: Pupils are equal, round, and reactive to light.  Neck:     Thyroid : No thyroid  mass, thyromegaly or  thyroid  tenderness.  Cardiovascular:     Rate and Rhythm: Normal rate and regular rhythm.     Pulses: Normal pulses.     Heart sounds: No murmur heard. Pulmonary:     Effort: Pulmonary effort is normal. No respiratory distress.     Breath sounds: Normal breath sounds. No stridor. No wheezing or rhonchi.  Abdominal:     General: Abdomen is flat. Bowel sounds are normal. There is no distension.     Palpations: Abdomen is soft. There is no mass.     Tenderness: There is no abdominal tenderness. There is no guarding.  Musculoskeletal:     Cervical back: Normal range of motion and neck supple. No rigidity or tenderness.     Right lower leg: No edema.     Left lower leg: No edema.     Right foot: Normal range of motion. No deformity.     Left foot: Normal range of motion. No deformity.  Feet:     Right foot:     Protective Sensation: 10 sites tested.  10 sites sensed.     Skin integrity: Skin integrity normal. No ulcer, blister, skin breakdown, erythema, warmth or dry skin.     Toenail Condition: Right toenails are normal.     Left foot:     Protective Sensation: 10 sites tested.  10 sites sensed.     Skin integrity: Skin integrity normal. No ulcer, blister, erythema, warmth or dry skin.     Toenail Condition: Left toenails are normal.  Lymphadenopathy:     Cervical: No cervical adenopathy.  Skin:    General: Skin is warm and dry.     Coloration: Skin is not jaundiced.     Findings: Lesion (L Ear, see photo) present. No bruising, erythema or rash.  Neurological:     General: No focal deficit present.     Mental Status: He is alert and oriented to person, place, and time.     Sensory: No sensory deficit.     Motor: No weakness.  Psychiatric:        Mood and Affect: Mood normal.        Behavior: Behavior normal.      No results found for any visits on 09/28/24.  Last CBC Lab Results  Component Value Date   WBC 8.1 07/12/2024   HGB 14.3 07/12/2024   HCT 42.3 07/12/2024    MCV 98.4 07/12/2024   MCH 33.3 07/12/2024   RDW 13.6 07/12/2024   PLT 172 07/12/2024   Last metabolic panel Lab Results  Component Value Date   GLUCOSE 171 (H) 07/12/2024   NA 141 07/12/2024   K  4.4 07/12/2024   CL 103 07/12/2024   CO2 26 07/12/2024   BUN 23 07/12/2024   CREATININE 1.15 07/12/2024   GFRNONAA >60 07/12/2024   CALCIUM  9.7 07/12/2024   PROT 7.6 07/12/2024   ALBUMIN 4.8 07/12/2024   BILITOT 0.7 07/12/2024   ALKPHOS 57 07/12/2024   AST 36 07/12/2024   ALT 52 (H) 07/12/2024   ANIONGAP 12 07/12/2024   Last lipids Lab Results  Component Value Date   CHOL 133 11/06/2022   HDL 30 (L) 11/06/2022   LDLCALC 70 11/06/2022   TRIG 254 (H) 11/06/2022   CHOLHDL 4.4 11/06/2022   Last hemoglobin A1c Lab Results  Component Value Date   HGBA1C 6.4 (A) 06/07/2024   Last thyroid  functions Lab Results  Component Value Date   TSH 2.279 09/07/2023   Last vitamin D No results found for: 25OHVITD2, 25OHVITD3, VD25OH Last vitamin B12 and Folate Lab Results  Component Value Date   VITAMINB12 1,049 (H) 07/12/2024   FOLATE 12.3 09/07/2023      The 10-year ASCVD risk score (Arnett DK, et al., 2019) is: 54.6%  Assessment & Plan:  Benign essential hypertension -     CMP14+EGFR -     CBC with Differential/Platelet  Gastroesophageal reflux disease without esophagitis -     B12 and Folate Panel  Type 2 diabetes mellitus with stage 2 chronic kidney disease, without long-term current use of insulin (HCC) -     Hemoglobin A1c -     Lipid panel  Hereditary hemochromatosis -     CMP14+EGFR -     CBC with Differential/Platelet -     Iron, TIBC and Ferritin Panel  Mixed hyperlipidemia -     Lipid panel  Screening for malignant neoplasm of prostate -     PSA, total and free  Skin lesion of left ear -     Ambulatory referral to Dermatology  Assessment and Plan    Type 2 diabetes mellitus Well-controlled with A1c of 6.4. - Ordered A1c test. - Continue  current diabetes management plan.  Essential hypertension Well-controlled with stable blood pressure readings. - Continue current antihypertensive medications.  Hyperlipidemia Last cholesterol check a year ago. - Ordered lipid panel.  Skin lesion of ear Lesion likely due to sun exposure. Dermatology referral for biopsy recommended. - Referred to dermatology for biopsy of ear lesion.  General Health Maintenance Discussed shingles and COVID-19 vaccinations. Declined both. Recent eye exam showed no diabetic retinopathy. - Requested eye exam records from Dr. Jeffie. - Discussed shingles and COVID-19 vaccinations.         Return in about 4 months (around 01/27/2025).   Benton LITTIE Gave, PA    [1]  Social History Tobacco Use   Smoking status: Former    Current packs/day: 0.00    Types: Cigarettes    Quit date: 11/17/1982    Years since quitting: 41.8   Smokeless tobacco: Never   Tobacco comments:    Quite over 20 yrs ago  Vaping Use   Vaping status: Never Used  Substance Use Topics   Alcohol use: Not Currently    Alcohol/week: 1.0 standard drink of alcohol    Types: 1 Cans of beer per week    Comment: Occasionally   Drug use: Never

## 2024-09-28 NOTE — Patient Instructions (Addendum)
 We updated your labs today. I placed a referral to dermatology.  Please schedule a follow up visit in 4 months.  Have a Merry Christmas.

## 2024-09-29 LAB — CBC WITH DIFFERENTIAL/PLATELET
Basophils Absolute: 0 x10E3/uL (ref 0.0–0.2)
Basos: 1 %
EOS (ABSOLUTE): 0.3 x10E3/uL (ref 0.0–0.4)
Eos: 3 %
Hematocrit: 41.2 % (ref 37.5–51.0)
Hemoglobin: 13.9 g/dL (ref 13.0–17.7)
Immature Grans (Abs): 0 x10E3/uL (ref 0.0–0.1)
Immature Granulocytes: 0 %
Lymphocytes Absolute: 1.7 x10E3/uL (ref 0.7–3.1)
Lymphs: 23 %
MCH: 33 pg (ref 26.6–33.0)
MCHC: 33.7 g/dL (ref 31.5–35.7)
MCV: 98 fL — ABNORMAL HIGH (ref 79–97)
Monocytes Absolute: 0.6 x10E3/uL (ref 0.1–0.9)
Monocytes: 9 %
Neutrophils Absolute: 4.8 x10E3/uL (ref 1.4–7.0)
Neutrophils: 64 %
Platelets: 159 x10E3/uL (ref 150–450)
RBC: 4.21 x10E6/uL (ref 4.14–5.80)
RDW: 12.8 % (ref 11.6–15.4)
WBC: 7.5 x10E3/uL (ref 3.4–10.8)

## 2024-09-29 LAB — B12 AND FOLATE PANEL
Folate: 15 ng/mL (ref >3.0)
Vitamin B-12: 869 pg/mL (ref 232–1245)

## 2024-09-29 LAB — CMP14+EGFR
ALT: 47 IU/L — ABNORMAL HIGH (ref 0–44)
AST: 29 IU/L (ref 0–40)
Albumin: 4.5 g/dL (ref 3.8–4.8)
Alkaline Phosphatase: 57 IU/L (ref 47–123)
BUN/Creatinine Ratio: 22 (ref 10–24)
BUN: 20 mg/dL (ref 8–27)
Bilirubin Total: 0.8 mg/dL (ref 0.0–1.2)
CO2: 20 mmol/L (ref 20–29)
Calcium: 9.8 mg/dL (ref 8.6–10.2)
Chloride: 103 mmol/L (ref 96–106)
Creatinine, Ser: 0.92 mg/dL (ref 0.76–1.27)
Globulin, Total: 2 g/dL (ref 1.5–4.5)
Glucose: 155 mg/dL — ABNORMAL HIGH (ref 70–99)
Potassium: 4.1 mmol/L (ref 3.5–5.2)
Sodium: 140 mmol/L (ref 134–144)
Total Protein: 6.5 g/dL (ref 6.0–8.5)
eGFR: 85 mL/min/1.73 (ref >59)

## 2024-09-29 LAB — HEMOGLOBIN A1C
Est. average glucose Bld gHb Est-mCnc: 151 mg/dL
Hgb A1c MFr Bld: 6.9 % — ABNORMAL HIGH (ref 4.8–5.6)

## 2024-09-29 LAB — LIPID PANEL
Chol/HDL Ratio: 4 ratio (ref 0.0–5.0)
Cholesterol, Total: 105 mg/dL (ref 100–199)
HDL: 26 mg/dL — ABNORMAL LOW (ref >39)
LDL Chol Calc (NIH): 47 mg/dL (ref 0–99)
Triglycerides: 196 mg/dL — ABNORMAL HIGH (ref 0–149)
VLDL Cholesterol Cal: 32 mg/dL (ref 5–40)

## 2024-09-29 LAB — IRON,TIBC AND FERRITIN PANEL
Ferritin: 202 ng/mL (ref 30–400)
Iron Saturation: 36 % (ref 15–55)
Iron: 112 ug/dL (ref 38–169)
Total Iron Binding Capacity: 312 ug/dL (ref 250–450)
UIBC: 200 ug/dL (ref 111–343)

## 2024-09-29 LAB — PSA, TOTAL AND FREE
PSA, Free Pct: 48.3 %
PSA, Free: 0.58 ng/mL
Prostate Specific Ag, Serum: 1.2 ng/mL (ref 0.0–4.0)

## 2024-10-04 ENCOUNTER — Ambulatory Visit: Admitting: Physician Assistant

## 2024-10-04 ENCOUNTER — Encounter: Payer: Self-pay | Admitting: Physician Assistant

## 2024-10-04 VITALS — BP 119/71

## 2024-10-04 DIAGNOSIS — L570 Actinic keratosis: Secondary | ICD-10-CM

## 2024-10-04 DIAGNOSIS — L578 Other skin changes due to chronic exposure to nonionizing radiation: Secondary | ICD-10-CM | POA: Diagnosis not present

## 2024-10-04 DIAGNOSIS — W908XXA Exposure to other nonionizing radiation, initial encounter: Secondary | ICD-10-CM | POA: Diagnosis not present

## 2024-10-04 NOTE — Patient Instructions (Signed)

## 2024-10-04 NOTE — Progress Notes (Signed)
" ° °  New Patient Visit   Subjective  Thomas Leonard is a 78 y.o. male NEW PATIENT who presents for the following: Spot of left ear that is scaly. He is not sure how long it has been there. It gets tender when he sleeps on his left side. No history of skin cancer. His wife died of Melanoma at the age of 4. His last full skin check was 2 years ago.    The following portions of the chart were reviewed this encounter and updated as appropriate: medications, allergies, medical history  Review of Systems:  No other skin or systemic complaints except as noted in HPI or Assessment and Plan.  Objective  Well appearing patient in no apparent distress; mood and affect are within normal limits.   A focused examination was performed of the following areas: Left ear, face   Relevant exam findings are noted in the Assessment and Plan.  Left Ear Erythematous thin macule with gritty scale.   Assessment & Plan   ACTINIC DAMAGE - chronic, secondary to cumulative UV radiation exposure/sun exposure over time - diffuse scaly erythematous macules with underlying dyspigmentation - Recommend daily broad spectrum sunscreen SPF 30+ to sun-exposed areas, reapply every 2 hours as needed.  - Recommend staying in the shade or wearing long sleeves, sun glasses (UVA+UVB protection) and wide brim hats (4-inch brim around the entire circumference of the hat). - RTC for full body skin exam soon.      AK (ACTINIC KERATOSIS) Left Ear - Destruction of lesion - Left Ear Complexity: simple   Destruction method: cryotherapy   Informed consent: discussed and consent obtained   Timeout:  patient name, date of birth, surgical site, and procedure verified Lesion destroyed using liquid nitrogen: Yes   Region frozen until ice ball extended beyond lesion: Yes   Outcome: patient tolerated procedure well with no complications   Post-procedure details: wound care instructions given    ACTINIC SKIN DAMAGE    Return  in about 5 months (around 03/04/2025) for TBSE.  I, Roseline Hutchinson, CMA, am acting as scribe for Posie Lillibridge K, PA-C .   Documentation: I have reviewed the above documentation for accuracy and completeness, and I agree with the above.  Ranesha Val K, PA-C    "

## 2024-10-08 LAB — LAB REPORT - SCANNED
Albumin, Urine POC: 1.4
Creatinine, POC: 128 mg/dL
EGFR: 64
Microalb Creat Ratio: 11

## 2024-10-24 ENCOUNTER — Ambulatory Visit: Payer: Self-pay | Admitting: Urgent Care

## 2025-01-09 ENCOUNTER — Inpatient Hospital Stay: Attending: Medical Oncology

## 2025-01-09 ENCOUNTER — Ambulatory Visit: Admitting: Medical Oncology

## 2025-01-27 ENCOUNTER — Ambulatory Visit: Admitting: Urgent Care

## 2025-02-27 ENCOUNTER — Ambulatory Visit: Admitting: Physician Assistant

## 2025-06-01 ENCOUNTER — Ambulatory Visit
